# Patient Record
Sex: Male | Born: 1985
Health system: Southern US, Community
[De-identification: ages and names within clinical notes are randomized; demographics above are authoritative.]

## PROBLEM LIST (undated history)

## (undated) DIAGNOSIS — K219 Gastro-esophageal reflux disease without esophagitis: Secondary | ICD-10-CM

## (undated) HISTORY — PX: NO PAST SURGERIES: SHX2092

---
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
## 2011-01-31 ENCOUNTER — Ambulatory Visit
Admission: RE | Admit: 2011-01-31 | Discharge: 2011-01-31 | Disposition: A | Discharge status: Home / Self Care | Payer: Self-pay | Source: Ambulatory Visit | Attending: Occupational Medicine | Admitting: Occupational Medicine

## 2011-01-31 ENCOUNTER — Other Ambulatory Visit: Payer: Self-pay | Admitting: Occupational Medicine

## 2011-01-31 DIAGNOSIS — Z021 Encounter for pre-employment examination: Secondary | ICD-10-CM

## 2016-03-02 ENCOUNTER — Emergency Department (HOSPITAL_COMMUNITY)
Admission: EM | Admit: 2016-03-02 | Discharge: 2016-03-02 | Disposition: A | Discharge status: Home / Self Care | Payer: Worker's Compensation | Attending: Emergency Medicine | Admitting: Emergency Medicine

## 2016-03-02 ENCOUNTER — Encounter (HOSPITAL_COMMUNITY): Payer: Self-pay

## 2016-03-02 DIAGNOSIS — Z7721 Contact with and (suspected) exposure to potentially hazardous body fluids: Secondary | ICD-10-CM | POA: Diagnosis not present

## 2016-03-02 LAB — HIV ANTIBODY (ROUTINE TESTING W REFLEX): HIV SCREEN 4TH GENERATION: NONREACTIVE
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
## 2016-03-02 NOTE — Discharge Instructions (Signed)
Body Fluid Exposure Information  People may come into contact with blood and other body fluids under various circumstances. In some cases, body fluids may contain germs (bacteria or viruses) that cause infections. These germs can be spread when another person's body fluids come into contact with your skin, mouth, eyes, or genitals.   Exposure to body fluids that may contain infectious material is a common problem for people providing care for others who are ill. It can occur when a person is performing health care tasks in the workplace or when taking care of a family member at home. Other common methods of exposure include injection drug use, sharing needles, and sexual activity.  The risk of an infection spreading through body fluid exposure is small and depends on a variety of factors. This includes the type of body fluid, the nature of the exposure, and the health status of the person who was the source of the body fluids. Your health care provider can help you assess the risk.  WHAT TYPES OF BODY FLUID CAN SPREAD INFECTION?  The following types of body fluid have the potential to spread infections:   Blood.   Semen.   Vaginal secretions.   Urine.   Feces.   Saliva.   Nasal or eye discharge.   Breast milk.   Amniotic fluid and fluids surrounding body organs.  WHAT ARE SOME FIRST-AID MEASURES FOR BODY FLUID EXPOSURE?  The following steps should be taken as soon as possible after a person is exposed to body fluids:  Intact Skin   For contact with closed skin, wash the area with soap and water.  Broken Skin   For contact with broken skin (a wound), wash the area with soap and water. Let the area bleed a little. Then place a bandage or clean towel on the wound, applying gentle pressure to stop the bleeding. Do not squeeze or rub the area.   Use just water or hand sanitizer if a sink with soap is not available.   Do not use harsh chemicals such as bleach or iodine.  Eyes   Rinse the eyes with water or  saline for 30 seconds.   If the person is wearing contact lenses, leave the contact lenses in while rinsing the eyes. Once the rinsing is complete, remove the contact lenses.  Mouth   Spit out the fluids. Rinse and spit with water 4-5 times.  In addition, you should remove any clothing that comes into contact with body fluids. However, if body fluid exposure results from sexual assault, seek medical care immediately without changing clothes or bathing.  WHEN SHOULD YOU SEEK HELP?  After performing the proper first-aid steps, you should contact your health care provider or seek emergency care right away if blood or other body fluids made contact with areas of broken skin or openings such as the eyes or mouth. If the exposure to body fluid happened in the workplace, you should report it to your work supervisor immediately. Many workplaces have procedures in place for exposure situations.  WHAT WILL HAPPEN AFTER YOU REPORT THE EXPOSURE?  Your health care provider will ask you several questions. Information requested may include:   Your medical history, including vaccination records.   Date and time of the exposure.   Whether you saw body fluids during the exposure.   Type of body fluid you were exposed to.   Volume of body fluid you were exposed to.   How the exposure happened.   If any devices,   such as a needle, were being used.   Which area of your body made contact with the body fluid.   Description of any injury to the skin or other area.   How long contact was made with the body fluid.   Any information you have about the health status of the person whose body fluid you were exposed to.  The health care provider will assess your risk of infection. Often, no treatment is necessary. In some cases, the health care provider may recommend doing blood tests right away. Follow-up blood tests may also be done at certain intervals during the upcoming weeks and months to check for changes. You may be offered  treatment to prevent an infection from developing after exposure (post-exposure prophylaxis). This may include certain vaccinations or medicines and may be necessary when there is a risk of a serious infection, such as HIV or hepatitis B. Your health care provider should discuss appropriate treatment and vaccinations with you.  HOW CAN YOU PREVENT EXPOSURE AND INFECTION?  Always remember that prevention is the first line of defense against body fluid exposure. To help prevent exposure to body fluids:   Wash and disinfect countertops and other surfaces regularly.   Wear appropriate protective gear such as gloves, gowns, or eyewear when the possibility of exposure is present.   Wipe away spills of body fluid with disposable towels.   Properly dispose of blood products and other fluids. Use secured bags.   Properly dispose of needles and other instruments with sharp points or edges (sharps). Use closed, marked containers.   Avoid injection drug use.   Do not share needles.   Avoid recapping needles.   Use a condom during sexual intercourse.   Make sure you learn and follow any guidelines for preventing exposure (universal precautions) provided at your workplace.  To help reduce your chances of getting an infection:   Make sure your vaccinations are up-to-date, including those for tetanus and hepatitis.   Wash your hands frequently with soap and water. Use hand sanitizers.   Avoid having multiple sex partners.   Follow up with your health care provider as directed after being evaluated for an exposure to body fluids.  To avoid spreading infection to others:   Do not have sexual relations until you know you are free of infection.   Do not donate blood, plasma, breast milk, sperm, or other body fluids.   Do not share hygiene tools such as toothbrushes, razors, or dental floss.   Keep open wounds covered.   Dispose of any items with blood on them (razors, tampons, bandages) by putting them in the  trash.   Do not share drug supplies with others, such as needles, syringes, straws, or pipes.   Follow all of your health care provider's instructions for preventing the spread of infection.     This information is not intended to replace advice given to you by your health care provider. Make sure you discuss any questions you have with your health care provider.     Document Released: 08/17/2013 Document Revised: 12/20/2013 Document Reviewed: 08/17/2013  Elsevier Interactive Patient Education 2016 [employer]

## 2016-03-02 NOTE — ED Notes (Signed)
Went over discharge instructions and follow-up information with patient, who verbalized understanding and denies any further needs or questions at this time. VS stable. Patient ambulatory with steady gait. 
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
## 2016-03-03 LAB — HEPATITIS PANEL, ACUTE
HEP B S AG: NEGATIVE
Hep A IgM: NEGATIVE
Hep B C IgM: NEGATIVE

## 2016-10-04 ENCOUNTER — Ambulatory Visit (INDEPENDENT_AMBULATORY_CARE_PROVIDER_SITE_OTHER): Payer: Worker's Compensation

## 2016-10-04 ENCOUNTER — Encounter (HOSPITAL_COMMUNITY): Payer: Self-pay | Admitting: Emergency Medicine

## 2016-10-04 ENCOUNTER — Ambulatory Visit (HOSPITAL_COMMUNITY)
Admission: EM | Admit: 2016-10-04 | Discharge: 2016-10-04 | Disposition: A | Discharge status: Home / Self Care | Payer: Worker's Compensation | Attending: Internal Medicine | Admitting: Internal Medicine

## 2016-10-04 DIAGNOSIS — T148XXA Other injury of unspecified body region, initial encounter: Secondary | ICD-10-CM

## 2016-10-04 MED ORDER — CYCLOBENZAPRINE HCL 10 MG PO TABS: 10.0000 mg | ORAL_TABLET | Freq: Three times a day (TID) | ORAL | 0 refills | Status: AC

## 2016-10-04 MED ORDER — TRAMADOL HCL 50 MG PO TABS: 50.0000 mg | ORAL_TABLET | Freq: Four times a day (QID) | ORAL | 0 refills | Status: DC | PRN
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
## 2017-04-09 ENCOUNTER — Encounter (INDEPENDENT_AMBULATORY_CARE_PROVIDER_SITE_OTHER): Payer: Self-pay

## 2017-04-09 ENCOUNTER — Encounter: Payer: Self-pay | Admitting: Gastroenterology

## 2017-04-09 ENCOUNTER — Other Ambulatory Visit: Payer: Self-pay

## 2017-04-09 ENCOUNTER — Ambulatory Visit (INDEPENDENT_AMBULATORY_CARE_PROVIDER_SITE_OTHER): Payer: 59 | Admitting: Gastroenterology

## 2017-04-09 VITALS — BP 147/91 | HR 96 | Temp 98.2°F | Ht 71.0 in | Wt 222.8 lb

## 2017-04-09 DIAGNOSIS — K2100 Gastro-esophageal reflux disease with esophagitis, without bleeding: Secondary | ICD-10-CM

## 2017-04-09 DIAGNOSIS — K219 Gastro-esophageal reflux disease without esophagitis: Secondary | ICD-10-CM | POA: Diagnosis not present

## 2017-04-09 NOTE — Progress Notes (Signed)
Gastroenterology Consultation  Referring Provider:     No ref. provider found Primary Care Physician:  No PCP Per Patient Primary Gastroenterologist:  Dr. Wyline Mood  Reason for Consultation:     Reflux         HPI:   Benjamin Montgomery is a 31 y.o. y/o male referred for consultation & management  by Dr. Bonnetta Barry PCP Per Patient.    He has been referred for reflux and hoarsness . He underwent laryngoscopy a month by ENT and was told her had an "ulcer", he had lost his voice after the flu and was evaluated by ENT. He was commenced on omeprazole after. This was a month back .   Reflux:  Onset : Began last year Symptoms: change of voice/hoarseness, hard to speak , itching sensation in his throat  Recent weight gain: no  Medications: flonase Narcotics or anticholinergics use : no  PPI /H2 blockers or Antacid  use and timing :omeprazole 40 mg , taken as needed  Dinner time : 6-7 pm , bedtime at 11.30-12 midnight , sits upright but may have a a snack before bedtime  Prior EGD: never  Family history of esophageal cancer:no   Colon cancer in his father in his 80's .   Presently he is off PPI and has absolutely no symptoms.   No past medical history on file.  No past surgical history on file.  Prior to Admission medications   Medication Sig Start Date End Date Taking? Authorizing Provider  amphetamine-dextroamphetamine (ADDERALL XR) 25 MG 24 hr capsule Take 25 mg by mouth every morning.    Historical Provider, MD  HYDROMET 5-1.5 MG/5ML syrup  03/11/17   Historical Provider, MD  ibuprofen (ADVIL,MOTRIN) 200 MG tablet Take 200 mg by mouth every 6 (six) hours as needed.    Historical Provider, MD  ibuprofen (ADVIL,MOTRIN) 800 MG tablet  01/21/17   Historical Provider, MD  levofloxacin (LEVAQUIN) 500 MG tablet  03/11/17   Historical Provider, MD  oseltamivir (TAMIFLU) 75 MG capsule  01/21/17   Historical Provider, MD  promethazine-dextromethorphan (PROMETHAZINE-DM) 6.25-15 MG/5ML syrup  01/21/17    Historical Provider, MD  traMADol (ULTRAM) 50 MG tablet Take 1 tablet (50 mg total) by mouth every 6 (six) hours as needed. 10/04/16   Lucia Estelle, NP    Family History  Problem Relation Age of Onset  . Hyperlipidemia Mother   . Hypertension Mother   . Cancer Father      Social History  Substance Use Topics  . Smoking status: Never Smoker  . Smokeless tobacco: Never Used  . Alcohol use Yes     Comment: 3 times a week    Allergies as of 04/09/2017  . (No Known Allergies)    Review of Systems:    All systems reviewed and negative except where noted in HPI.   Physical Exam:  There were no vitals taken for this visit. No LMP for male patient. Psych:  Alert and cooperative. Normal mood and affect. General:   Alert,  Well-developed, well-nourished, pleasant and cooperative in NAD Head:  Normocephalic and atraumatic. Eyes:  Sclera clear, no icterus.   Conjunctiva pink. Ears:  Normal auditory acuity. Nose:  No deformity, discharge, or lesions. Mouth:  No deformity or lesions,oropharynx pink & moist. Neck:  Supple; no masses or thyromegaly. Lungs:  Respirations even and unlabored.  Clear throughout to auscultation.   No wheezes, crackles, or rhonchi. No acute distress. Heart:  Regular rate and rhythm; no murmurs, clicks, rubs,  or gallops. Abdomen:  Normal bowel sounds.  No bruits.  Soft, non-tender and non-distended without masses, hepatosplenomegaly or hernias noted.  No guarding or rebound tenderness.    Msk:  Symmetrical without gross deformities. Good, equal movement & strength bilaterally. Pulses:  Normal pulses noted.e. Lymph Nodes:  No significant cervical adenopathy. Psych:  Alert and cooperative. Normal mood and affect.  Imaging Studies: No results found.  Assessment and Plan:   Benjamin Montgomery is a 31 y.o. y/o male has been referred for reflux.Based on his history it appears he had a bad bout of reflux during an episode of flu when he had a lot of coughing which may have  increased his intrabdominal pressure during every spell of coughing and caused reflux and subsequent laryngeal inflammation. Presently he has no symptoms. Suggested life style changes. He has had a foreign body sensation in his throat and will proceed with an endoscopy, if it shows esophagitis off PPi then would be an indication for long term acid supression.   1. GERD : Counseled on life style changes, suggest to use PPI first thing in the morning on empty stomach and eat 30 minutes after. Advised on the use of a wedge pillow at night , avoid meals for 2 hours prior to bed time.   Discussed the risks and benefits of long term PPI use including but not limited to bone loss, chronic kidney disease, infections , low magnesium . Aim to use at the lowest dose for the shortest period of time if we did in the future  2. I will try and obtain the ENT report where he apparently underwent a laryngoscopy .   3. EGD  I have discussed alternative options, risks & benefits,  which include, but are not limited to, bleeding, infection, perforation,respiratory complication & drug reaction.  The patient agrees with this plan & written consent will be obtained.    Follow up PRN  Dr Wyline Mood MD

## 2017-04-17 ENCOUNTER — Telehealth: Payer: Self-pay | Admitting: Gastroenterology

## 2017-04-17 NOTE — Telephone Encounter (Signed)
04/17/17 Spoke with Vonna Kotyk at [facility] and Berkley Harvey is required for EGD 43235 / K21.9 Auth # [id number]

## 2017-05-11 ENCOUNTER — Telehealth: Payer: Self-pay | Admitting: Gastroenterology

## 2017-05-11 NOTE — Telephone Encounter (Signed)
Patient called to cancel his procedure. He said he was doing better and financially he can't afford it at this time. I called [facility] to cancel

## 2017-05-12 ENCOUNTER — Encounter: Admission: RE | Payer: Self-pay | Source: Ambulatory Visit

## 2017-05-12 ENCOUNTER — Ambulatory Visit: Admission: RE | Admit: 2017-05-12 | Payer: 59 | Source: Ambulatory Visit | Admitting: Gastroenterology

## 2017-05-12 SURGERY — ESOPHAGOGASTRODUODENOSCOPY (EGD) WITH PROPOFOL
Anesthesia: General

## 2017-06-18 ENCOUNTER — Ambulatory Visit (INDEPENDENT_AMBULATORY_CARE_PROVIDER_SITE_OTHER): Payer: 59

## 2017-06-18 ENCOUNTER — Encounter: Payer: Self-pay | Admitting: Sports Medicine

## 2017-06-18 ENCOUNTER — Ambulatory Visit (INDEPENDENT_AMBULATORY_CARE_PROVIDER_SITE_OTHER): Payer: 59 | Admitting: Sports Medicine

## 2017-06-18 VITALS — BP 170/92 | HR 97 | Ht 71.0 in | Wt 223.4 lb

## 2017-06-18 DIAGNOSIS — M19071 Primary osteoarthritis, right ankle and foot: Secondary | ICD-10-CM | POA: Insufficient documentation

## 2017-06-18 DIAGNOSIS — M25571 Pain in right ankle and joints of right foot: Secondary | ICD-10-CM

## 2017-06-18 DIAGNOSIS — S93324S Dislocation of tarsometatarsal joint of right foot, sequela: Secondary | ICD-10-CM | POA: Diagnosis not present

## 2017-06-18 NOTE — Progress Notes (Signed)
OFFICE VISIT NOTE Benjamin Montgomery. [name] at [facility] [phone]  [name] - 31 y.o. male MRN [id number]  Date of birth: [birth year]  Visit Date: 06/18/2017  PCP: Patient, No Pcp Per   Referred by: No ref. provider found  [name], CMA acting as scribe for Dr. Berline Chough.  SUBJECTIVE:   Chief Complaint  Patient presents with  . right ankle injury   HPI: As below and per problem based documentation when appropriate.  Pt presents today with complaint of pain in the right ankle. Pain is worse in the morning and at night but constant throughout the day. Pain is located on the lateral aspect of the ankle and the dorsal aspect of the mid foot. He did notice some swelling in the ankle and mild skin discoloration yesterday.  Pt started feeling pain in the right ankle 2 days ago and it has progressively gotten worse.  The pain started after running a long distance. It didn't hurt initially but as time went by it started and got worse.  He does report a prior significant foot/ankle injury in high school but had no sequela until this time.  The pain is described as sharp and is rated as 6-10/10 in the morning and 5/10 throughout the day.  Worsened with heel strike and supination of the ankle.  Improves with icing and compression Therapies tried include : 800 mg Ibuprofen with some relief.   Other associated symptoms include: Pt denies pain in the foot, leg, knee.   Pt denies fever, chills, night sweats.     Review of Systems  Constitutional: Negative for chills and fever.  Respiratory: Negative for shortness of breath and wheezing.   Cardiovascular: Positive for leg swelling. Negative for chest pain and palpitations.  Musculoskeletal: Negative for falls.  Neurological: Negative for dizziness, tingling and headaches.  Endo/Heme/Allergies: Does not bruise/bleed easily.    Otherwise per HPI.  HISTORY & PERTINENT PRIOR DATA:  No  specialty comments available. He reports that he has never smoked. He has never used smokeless tobacco. No results for input(s): HGBA1C, LABURIC in the last 8760 hours. Medications & Allergies reviewed per EMR Patient Active Problem List   Diagnosis Date Noted  . Arthritis of foot, right 06/18/2017  . Lisfranc dislocation, right, sequela 06/18/2017   No past medical history on file. Family History  Problem Relation Age of Onset  . Hyperlipidemia Mother   . Hypertension Mother   . Cancer Father    No past surgical history on file. Social History   Occupational History  . Not on file.   Social History Main Topics  . Smoking status: Never Smoker  . Smokeless tobacco: Never Used  . Alcohol use Yes     Comment: 3 times a week  . Drug use: No  . Sexual activity: Not on file    OBJECTIVE:  VS:  HT:5\' 11"  (180.3 cm)   WT:223 lb 6.4 oz (101.3 kg)  BMI:31.2    BP:(!) 170/92  HR:97bpm  TEMP: ( )  RESP:98 % EXAM: Findings:  WDWN, NAD, Non-toxic appearing Alert & appropriately interactive Not depressed or anxious appearing No increased work of breathing. Pupils are equal. EOM intact without nystagmus No clubbing or cyanosis of the extremities appreciated No significant rashes/lesions/ulcerations overlying the examined area. DP & PT pulses 2+/4.  No significant pretibial edema. Sensation intact to light touch in lower extremities.  Bilateral feet: Markedly flat feet with significant widening of the  right greater than left forefoot.  He has early bunion formation as well as leg to her second toes right greater than left.  He has bossing across the dorsum of the right midfoot and a moderate amount of pain with forefoot abduction.  X-ray is independently reviewed by myself and do show significant widening between the first and second metatarsals on the right foot, normal on the left.  There are degenerative changes.     Dg Foot 2 Views Left  Result Date: 06/18/2017 CLINICAL  DATA:  Left foot pain EXAM: LEFT FOOT - 2 VIEW COMPARISON:  None. FINDINGS: There is no evidence of fracture or dislocation. There is no evidence of arthropathy or other focal bone abnormality. Soft tissues are unremarkable. IMPRESSION: 1. No acute osseous injury of the left foot. If there is persisting clinical concern regarding Lisfranc injury, recommend evaluation with CT of the left foot. Electronically Signed   By: Elige KoHetal  Patel   On: 06/18/2017 11:57   Dg Foot Complete Right  Result Date: 06/18/2017 CLINICAL DATA:  Right ankle pain after running long distance EXAM: RIGHT FOOT COMPLETE - 3+ VIEW COMPARISON:  None. FINDINGS: No acute bony abnormality. Specifically, no fracture, subluxation, or dislocation. Soft tissues are intact. Spurring noted along the dorsum of the midfoot in the tarsal bones. Joint spaces are maintained. IMPRESSION: No acute bony abnormality. Electronically Signed   By: Charlett NoseKevin  Dover M.D.   On: 06/18/2017 09:39   ASSESSMENT & PLAN:   Problem List Items Addressed This Visit    Arthritis of foot, right    Age advanced arthritis.  >50% of this 30 minute visit spent in direct patient counseling and/or coordination of care.  Discussion was focused on education regarding the in discussing the pathoetiology and anticipated clinical course of the above condition.  Does seem to be most consistent with prior Lisfranc injury.  Activity modifications discussed in detail today.      Relevant Medications   ibuprofen (ADVIL,MOTRIN) 800 MG tablet   Lisfranc dislocation, right, sequela    Correlated Lisfranc injury in high school that has resulted in breakdown right midfoot.  He has H advanced arthritis which is likely due to hypermobility in this region.  He is not interested and fusion at this time like to try conservative measures including custom cushion orthotics.  He will follow-up for this.  Discussed activity modifications including minimizing significant pounding and weightbearing  activities.  No work restriction at this time.       Other Visit Diagnoses    Right ankle pain, unspecified chronicity    -  Primary   Relevant Orders   DG Foot Complete Right (Completed)   DG Foot 2 Views Left (Completed)      Follow-up: Return for orthotics.   CMA/ATC served as Neurosurgeonscribe during this visit. History, Physical, and Plan performed by medical provider. Documentation and orders reviewed and attested to.      Gaspar BiddingMichael Aquanetta Schwarz, DO    [facility]

## 2017-06-18 NOTE — Patient Instructions (Signed)
I recommend you obtained a compression sleeve to help with your joint problems. There are many options on the market however I recommend obtaining a full ankle Body Helix compression sleeve.  You can find information (including how to appropriate measure yourself for sizing) can be found at www.Body Helix.com.  Many of these products are health savings account (HSA) eligible.   You can use the compression sleeve at any time throughout the day but is most important to use while being active as well as for 2 hours post-activity.   It is appropriate to ice following activity with the compression sleeve in place.   

## 2017-06-29 ENCOUNTER — Ambulatory Visit (INDEPENDENT_AMBULATORY_CARE_PROVIDER_SITE_OTHER): Payer: 59 | Admitting: Sports Medicine

## 2017-06-29 ENCOUNTER — Encounter: Payer: Self-pay | Admitting: Sports Medicine

## 2017-06-29 VITALS — BP 140/92 | HR 76 | Ht 71.0 in | Wt 219.0 lb

## 2017-06-29 DIAGNOSIS — S93324S Dislocation of tarsometatarsal joint of right foot, sequela: Secondary | ICD-10-CM

## 2017-06-29 NOTE — Progress Notes (Signed)
OFFICE VISIT NOTE Benjamin FellsMichael D. Montgomery Shinerigby, DO  [name] [phone]  Benjamin LoganJohn Montgomery - 31 y.o. male MRN [id number]  Date of birth: 1986-12-04  Visit Date: 06/29/2017  PCP: Patient, No Pcp Per   Referred by: No ref. provider found  Benjamin DakinBrandy Montgomery, CMA acting as scribe for Dr. Berline Montgomery.  SUBJECTIVE:   Chief Complaint  Patient presents with  . Follow-up    ankle pain, Lisfranc dislocation, right ankle   HPI: As below and per problem based documentation when appropriate.  Pt presents today to discuss orthotics. He was seen 06/18/17 with complaint of ankle pain. Pain started around 06/16/17 after running for long distances. He had no pain while running, just after completing his run. Pain was occurring mostly during heel strike. He had significant ankle injury in high school but no reoccurrence of pain since then until recently. He had been trying Ibuprofen 800 mg, icing, and elevating. He was advised to minimize significant pounding and weight bearing exercises after his last visit. As of his last visit he was not interested in a fusion but wanted to try more conservative measures including custom cushion orthotics.  He had xray of left and right foot 06/18/17. Xray of right foot was normal, xray of left foot showed the following:  IMPRESSION: 1. No acute osseous injury of the left foot. If there is persisting clinical concern regarding Lisfranc injury, recommend evaluation with CT of the left foot.  Pt reports improvement with ankle pain while wearing his Body Helix. He has minimized running but admits that other than that he has not been minimizing pounding or weight bearing exercises. He has not taken the Ibuprofen since last week. He no longer needs to ice or elevate the ankle.   Pt denies fever, chills, night sweats    Review of Systems  Constitutional: Negative for chills and fever.  Respiratory: Negative for shortness of breath and wheezing.    Cardiovascular: Negative for chest pain and palpitations.  Musculoskeletal: Negative for falls.  Neurological: Negative for dizziness, tingling and headaches.  Endo/Heme/Allergies: Does not bruise/bleed easily.    Otherwise per HPI.  HISTORY & PERTINENT PRIOR DATA:  No specialty comments available. He reports that he has never smoked. He has never used smokeless tobacco. No results for input(s): HGBA1C, LABURIC in the last 8760 hours. Medications & Allergies reviewed per EMR Patient Active Problem List   Diagnosis Date Noted  . Arthritis of foot, right 06/18/2017  . Lisfranc dislocation, right, sequela 06/18/2017   No past medical history on file. Family History  Problem Relation Age of Onset  . Hyperlipidemia Mother   . Hypertension Mother   . Cancer Father    No past surgical history on file. Social History   Occupational History  . Not on file.   Social History Main Topics  . Smoking status: Never Smoker  . Smokeless tobacco: Never Used  . Alcohol use Yes     Comment: 3 times a week  . Drug use: No  . Sexual activity: Not on file    OBJECTIVE:  VS:  HT:5\' 11"  (180.3 cm)   WT:219 lb (99.3 kg)  BMI:30.6    BP:(!) 140/92  HR:76bpm  TEMP: ( )  RESP:98 % EXAM: Findings:  WDWN, NAD, Non-toxic appearing Alert & appropriately interactive Not depressed or anxious appearing No increased work of breathing. Pupils are equal. EOM intact without nystagmus No clubbing or cyanosis of the extremities appreciated No significant rashes/lesions/ulcerations  overlying the examined area. DP & PT pulses 2+/4.  No significant pretibial edema. Sensation intact to light touch in lower extremities.  Bilateral feet: Markedly flat feet with significant widening of the right greater than left forefoot.  He has early bunion formation as well splay toe of second toes right greater than left.  He has bossing across the dorsum of the right midfoot and a mild amount of pain with forefoot  abduction.     Dg Foot 2 Views Left  Result Date: 06/18/2017 CLINICAL DATA:  Left foot pain EXAM: LEFT FOOT - 2 VIEW COMPARISON:  None. FINDINGS: There is no evidence of fracture or dislocation. There is no evidence of arthropathy or other focal bone abnormality. Soft tissues are unremarkable. IMPRESSION: 1. No acute osseous injury of the left foot. If there is persisting clinical concern regarding Lisfranc injury, recommend evaluation with CT of the left foot. Electronically Signed   By: Benjamin Montgomery   On: 06/18/2017 11:57   Dg Foot Complete Right  Result Date: 06/18/2017 CLINICAL DATA:  Right ankle pain after running long distance EXAM: RIGHT FOOT COMPLETE - 3+ VIEW COMPARISON:  None. FINDINGS: No acute bony abnormality. Specifically, no fracture, subluxation, or dislocation. Soft tissues are intact. Spurring noted along the dorsum of the midfoot in the tarsal bones. Joint spaces are maintained. IMPRESSION: No acute bony abnormality. Electronically Signed   By: Benjamin Montgomery M.D.   On: 06/18/2017 09:39   ASSESSMENT & PLAN:   Problem List Items Addressed This Visit    Lisfranc dislocation, right, sequela - Primary    PROCEDURE: CUSTOM ORTHOTIC FABRICATION Patient's underlying musculoskeletal conditions are directly related to poor biomechanics and will benefit from a functional custom orthotic.  There are no significant foot deformities that complicate the use of a custom orthotic.  The patient was fitted for a standard, cushioned, semi-rigid orthotic. The orthotic was heated & placed on the orthotic stand. The patient was positioned in subtalar neutral position and 10 of ankle dorsiflexion and weight bearing stance some heated orthotic blank. After completion of the molding a stable paste was applied to the orthotic blank. The orthotic was ground to a stable position for weightbearing. The patient ambulated in these and reported they were comfortable without pressure spots.              BLANK:    Size 10 - Standard Cushioned                 BASE:  Blue EVA      POSTINGS:  N/a          Follow-up: Return if symptoms worsen or fail to improve.   CMA/ATC served as Neurosurgeon during this visit. History, Physical, and Plan performed by medical provider. Documentation and orders reviewed and attested to.      Gaspar Bidding, DO    [facility]
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
## 2017-06-29 NOTE — Assessment & Plan Note (Signed)
Age advanced arthritis.  >50% of this 30 minute visit spent in direct patient counseling and/or coordination of care.  Discussion was focused on education regarding the in discussing the pathoetiology and anticipated clinical course of the above condition.  Does seem to be most consistent with prior Lisfranc injury.  Activity modifications discussed in detail today.

## 2017-06-30 NOTE — Assessment & Plan Note (Signed)
PROCEDURE: CUSTOM ORTHOTIC FABRICATION Patient's underlying musculoskeletal conditions are directly related to poor biomechanics and will benefit from a functional custom orthotic.  There are no significant foot deformities that complicate the use of a custom orthotic.  The patient was fitted for a standard, cushioned, semi-rigid orthotic. The orthotic was heated & placed on the orthotic stand. The patient was positioned in subtalar neutral position and 10 of ankle dorsiflexion and weight bearing stance some heated orthotic blank. After completion of the molding a stable paste was applied to the orthotic blank. The orthotic was ground to a stable position for weightbearing. The patient ambulated in these and reported they were comfortable without pressure spots.              BLANK:  Size 10 - Standard Cushioned                 BASE:  Blue EVA      POSTINGS:  N/a

## 2017-09-20 ENCOUNTER — Emergency Department (HOSPITAL_COMMUNITY): Payer: Worker's Compensation

## 2017-09-20 ENCOUNTER — Emergency Department (HOSPITAL_COMMUNITY)
Admission: EM | Admit: 2017-09-20 | Discharge: 2017-09-20 | Disposition: A | Discharge status: Home / Self Care | Payer: Worker's Compensation | Attending: Emergency Medicine | Admitting: Emergency Medicine

## 2017-09-20 ENCOUNTER — Encounter (HOSPITAL_COMMUNITY): Payer: Self-pay | Admitting: *Deleted

## 2017-09-20 DIAGNOSIS — Y9389 Activity, other specified: Secondary | ICD-10-CM | POA: Diagnosis not present

## 2017-09-20 DIAGNOSIS — S39012A Strain of muscle, fascia and tendon of lower back, initial encounter: Secondary | ICD-10-CM | POA: Insufficient documentation

## 2017-09-20 DIAGNOSIS — M62830 Muscle spasm of back: Secondary | ICD-10-CM | POA: Diagnosis not present

## 2017-09-20 DIAGNOSIS — Y990 Civilian activity done for income or pay: Secondary | ICD-10-CM | POA: Diagnosis not present

## 2017-09-20 DIAGNOSIS — Y929 Unspecified place or not applicable: Secondary | ICD-10-CM | POA: Insufficient documentation

## 2017-09-20 DIAGNOSIS — M545 Low back pain: Secondary | ICD-10-CM | POA: Diagnosis present

## 2017-09-20 DIAGNOSIS — Y33XXXA Other specified events, undetermined intent, initial encounter: Secondary | ICD-10-CM | POA: Diagnosis not present

## 2017-09-20 MED ORDER — HYDROCODONE-ACETAMINOPHEN 5-325 MG PO TABS: 1.0000 | ORAL_TABLET | Freq: Four times a day (QID) | ORAL | 0 refills | Status: DC | PRN

## 2017-09-20 MED ORDER — BACLOFEN 10 MG PO TABS: 10.0000 mg | ORAL_TABLET | Freq: Three times a day (TID) | ORAL | 0 refills | Status: DC

## 2017-09-20 MED ORDER — DEXAMETHASONE SODIUM PHOSPHATE 10 MG/ML IJ SOLN
10.0000 mg | Freq: Once | INTRAMUSCULAR | Status: AC
  Administered 2017-09-20: 10 mg via INTRAMUSCULAR
  Filled 2017-09-20: qty 1

## 2017-09-20 MED ORDER — KETOROLAC TROMETHAMINE 60 MG/2ML IM SOLN
60.0000 mg | Freq: Once | INTRAMUSCULAR | Status: AC
  Administered 2017-09-20: 60 mg via INTRAMUSCULAR
  Filled 2017-09-20: qty 2

## 2017-09-20 MED ORDER — MELOXICAM 15 MG PO TABS: 15.0000 mg | ORAL_TABLET | Freq: Every day | ORAL | 0 refills | Status: DC
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
## 2017-09-20 NOTE — Discharge Instructions (Signed)
Your imaging was negative.  SEEK IMMEDIATE MEDICAL ATTENTION IF: New numbness, tingling, weakness, or problem with the use of your arms or legs.  Severe back pain not relieved with medications.  Change in bowel or bladder control.  Increasing pain in any areas of the body (such as chest or abdominal pain).  Shortness of breath, dizziness or fainting.  Nausea (feeling sick to your stomach), vomiting, fever, or sweats.

## 2017-09-20 NOTE — ED Notes (Signed)
Patient transported to X-ray 

## 2017-09-20 NOTE — ED Triage Notes (Signed)
PT was getting stuff off truck, step down and took 3 steps and felt a sharp pain in right lower back and not improving.  Happened this am

## 2017-09-20 NOTE — ED Notes (Signed)
ED Provider at bedside. 

## 2017-11-03 ENCOUNTER — Telehealth: Payer: Self-pay | Admitting: General Practice

## 2017-11-03 NOTE — Telephone Encounter (Signed)
Patient calling to state he moved and just received a bill with an outstanding balance. Patient wants to know if we have the ability to pull up HSA information as they have lost their card, but want to pay the balance. I informed patient he may need to contact his insurance, but I was ultimately unsure. Asked for return call when convenient.  Please advise.

## 2017-11-05 NOTE — Telephone Encounter (Signed)
After reading the message below, this is something billing would be able to help with. Please call patient and provide billing number for him to contact.

## 2017-11-09 NOTE — Telephone Encounter (Signed)
Explained to pt he would need to contact billing to try and resolve issue per [place]. Pt acknowledged understanding and stated he would call back later to get billing's number. No further action needed.

## 2018-03-01 DIAGNOSIS — R6882 Decreased libido: Secondary | ICD-10-CM | POA: Diagnosis not present

## 2018-09-03 ENCOUNTER — Other Ambulatory Visit: Payer: Self-pay | Admitting: Internal Medicine

## 2018-09-03 ENCOUNTER — Ambulatory Visit
Admission: RE | Admit: 2018-09-03 | Discharge: 2018-09-03 | Disposition: A | Discharge status: Home / Self Care | Payer: 59 | Source: Ambulatory Visit | Attending: Internal Medicine | Admitting: Internal Medicine

## 2018-09-03 DIAGNOSIS — M79671 Pain in right foot: Secondary | ICD-10-CM

## 2018-11-23 DIAGNOSIS — Z23 Encounter for immunization: Secondary | ICD-10-CM | POA: Diagnosis not present

## 2019-01-30 DIAGNOSIS — M109 Gout, unspecified: Secondary | ICD-10-CM | POA: Diagnosis not present

## 2019-01-31 ENCOUNTER — Ambulatory Visit: Payer: 59 | Admitting: Sports Medicine

## 2019-01-31 ENCOUNTER — Ambulatory Visit: Payer: Self-pay

## 2019-01-31 ENCOUNTER — Encounter: Payer: Self-pay | Admitting: Sports Medicine

## 2019-01-31 VITALS — BP 130/86 | HR 72 | Ht 71.0 in | Wt 239.4 lb

## 2019-01-31 DIAGNOSIS — M25475 Effusion, left foot: Secondary | ICD-10-CM

## 2019-01-31 DIAGNOSIS — M79675 Pain in left toe(s): Secondary | ICD-10-CM | POA: Diagnosis not present

## 2019-01-31 NOTE — Patient Instructions (Signed)

## 2019-01-31 NOTE — Procedures (Signed)
PROCEDURE NOTE:  Ultrasound Guided: Injection: Left Great toe, MTP, Intra-articular Images were obtained and interpreted by myself, Gaspar Bidding, DO  Images have been saved and stored to PACS system. Images obtained on: GE S7 Ultrasound machine    ULTRASOUND FINDINGS:  The first MTP has a moderate effusion with intracellular debris/starry night appearance consistent with likely gouty arthropathy.  DESCRIPTION OF PROCEDURE:  The patient's clinical condition is marked by substantial pain and/or significant functional disability. Other conservative therapy has not provided relief, is contraindicated, or not appropriate. There is a reasonable likelihood that injection will significantly improve the patient's pain and/or functional impairment.   After discussing the risks, benefits and expected outcomes of the injection and all questions were reviewed and answered, the patient wished to undergo the above named procedure.  Verbal consent was obtained.  The ultrasound was used to identify the target structure and adjacent neurovascular structures. The skin was then prepped in sterile fashion and the target structure was injected under direct visualization using sterile technique as below:  Single injection performed as below: PREP: Alcohol and Ethel Chloride APPROACH:direct, single injection, 25g 1.5 in. INJECTATE: 0.5 cc 0.5% Marcaine and 0.5 cc 80mg /mL DepoMedrol ASPIRATE: None DRESSING: Band-Aid  Post procedural instructions including recommending icing and warning signs for infection were reviewed.    This procedure was well tolerated and there were no complications.   IMPRESSION: Succesful Ultrasound Guided: Injection

## 2019-01-31 NOTE — Progress Notes (Signed)
Benjamin Montgomery. Benjamin Montgomery Sports Medicine [facility] at [facility] [phone]  Benjamin Montgomery - 33 y.o. male MRN [id number]  Date of birth: [birth year]  Visit Date: January 31, 2019  PCP: Benjamin Noble, MD   Referred by: Benjamin Noble, MD  SUBJECTIVE:  Chief Complaint  Patient presents with  . Left Foot - Initial Assessment    Sx x 3 days. Worse w/ wt-bearing. Some swelling around big toe. XR and Toradol inj at [facility] 01/30/19.   . Initial Assessment    L foot (great toe) pain    HPI: Patient presents with 3 days of worsening left great toe pain.  This is been present since Saturday afternoon when he was jumping at a trampoline park.  Following this he had slow but significant worsening of pain that localized to the left great toe.  He denies any significant known trauma.  He has been training for half marathon.  He does have a history of Lisfranc and is however has been well from this regard.  He denies any family history of gout.  Denies any changes in diet.  No prior episodes that are similar to this.  [facility] where [facility] were obtained that were normal and prescription for anti-inflammatory was provided that he has not picked up.  He did get a shot of Toradol with only minimal improvement.  REVIEW OF SYSTEMS: He is having some pain that keeps him awake at night due to this.  Otherwise his health is good  HISTORY:  Prior history reviewed and updated per electronic medical record.  Patient Active Problem List   Diagnosis Date Noted  . Arthritis of foot, right 06/18/2017  . Lisfranc dislocation, right, sequela 06/18/2017   Social History   Occupational History  . Not on file  Tobacco Use  . Smoking status: Never Smoker  . Smokeless tobacco: Current User    Types: Chew  Substance and Sexual Activity  . Alcohol use: Yes    Comment: 3 times a week  . Drug use: No  . Sexual activity: Not on file   Social History   Social History Narrative   . Not on file    OBJECTIVE:  VS:  HT:5\' 11"  (180.3 cm)   WT:239 lb 6.4 oz (108.6 kg)  BMI:33.4    BP:130/86  HR:72bpm  TEMP: ( )  RESP:97 %   PHYSICAL EXAM: Adult male.  In no acute distress.  Alert and appropriate.  His left great toe has a moderate amount of swelling and generalized erythema surrounding it there is good capillary refill.  DP and PT pulses are 2+/4.  No significant pretibial edema.  He has a markedly high cavus foot.  He has pain with any type of active or passive range of motion of his MTP on the left foot.  Normal on the right.  He has marked pain with direct palpation over the MTP.  Mild pain with metatarsal squeeze as long as avoid the MTP.  He has no pain with IP range of motion.   ASSESSMENT:   1. Effusion of joint of toe of left foot   2. Pain of toe of left foot     PROCEDURES:  US Guided Injection per procedure note       PLAN:  Pertinent additional documentation may be included in corresponding procedure notes, imaging studies, problem based documentation and patient instructions.  No problem-specific Assessment & Plan notes found for this encounter.  Ultimately symptoms are consistent with a great toe effusion likely secondary to gout.  We will go ahead and perform an this today and plan to have him follow-up in 6 weeks for lab work.  Activity modifications and the importance of avoiding exacerbating activities (limiting pain to no more than a 4 / 10 during or following activity) recommended and discussed.   Discussed red flag symptoms that warrant earlier emergent evaluation and patient voices understanding.    No orders of the defined types were placed in this encounter.  Lab Orders  No laboratory test(s) ordered today    Imaging Orders     [place] [facility] POCT ULTRASOUND Referral Orders  No referral(s) requested today    Return in about 6 weeks (around 03/14/2019) for lab check.          Benjamin Mews, DO     Sports Medicine  Physician

## 2019-03-14 ENCOUNTER — Other Ambulatory Visit: Payer: Self-pay

## 2019-03-14 ENCOUNTER — Ambulatory Visit (INDEPENDENT_AMBULATORY_CARE_PROVIDER_SITE_OTHER): Payer: 59 | Admitting: Sports Medicine

## 2019-03-14 ENCOUNTER — Encounter: Payer: Self-pay | Admitting: Sports Medicine

## 2019-03-14 VITALS — BP 130/84 | HR 74 | Ht 71.0 in | Wt 231.6 lb

## 2019-03-14 DIAGNOSIS — M25475 Effusion, left foot: Secondary | ICD-10-CM

## 2019-03-14 DIAGNOSIS — M79675 Pain in left toe(s): Secondary | ICD-10-CM

## 2019-03-14 LAB — COMPREHENSIVE METABOLIC PANEL
ALK PHOS: 49 U/L (ref 39–117)
ALT: 23 U/L (ref 0–53)
AST: 18 U/L (ref 0–37)
Albumin: 4.5 g/dL (ref 3.5–5.2)
BILIRUBIN TOTAL: 0.6 mg/dL (ref 0.2–1.2)
BUN: 14 mg/dL (ref 6–23)
CO2: 29 meq/L (ref 19–32)
CREATININE: 1.18 mg/dL (ref 0.40–1.50)
Calcium: 9.5 mg/dL (ref 8.4–10.5)
Chloride: 104 mEq/L (ref 96–112)
GFR: 71.94 mL/min (ref >60.00)
Glucose, Bld: 100 mg/dL — ABNORMAL HIGH (ref 70–99)
Potassium: 4.5 mEq/L (ref 3.5–5.1)
Sodium: 139 mEq/L (ref 135–145)
Total Protein: 6.8 g/dL (ref 6.0–8.3)

## 2019-03-14 LAB — CBC
HCT: 44.3 % (ref 39.0–52.0)
Hemoglobin: 15.1 g/dL (ref 13.0–17.0)
MCHC: 34 g/dL (ref 30.0–36.0)
MCV: 89 fl (ref 78.0–100.0)
Platelets: 223 10*3/uL (ref 150.0–400.0)
RBC: 4.98 Mil/uL (ref 4.22–5.81)
RDW: 13.7 % (ref 11.5–15.5)
WBC: 6.7 10*3/uL (ref 4.0–10.5)

## 2019-03-14 LAB — URIC ACID: URIC ACID, SERUM: 7.5 mg/dL (ref 4.0–7.8)

## 2019-03-14 NOTE — Progress Notes (Signed)
Benjamin Montgomery. Benjamin Montgomery Sports Medicine [facility] at [facility] [phone]  Benjamin Montgomery - 33 y.o. male MRN [id number]  Date of birth: Jun 30, 1986  Visit Date: 03/14/2019  PCP: Benjamin Noble, MD   Referred by: Benjamin Noble, MD  SUBJECTIVE:  Chief Complaint  Patient presents with  . Left Great Toe - Follow-up    XR at [facility] 01/30/2019. Corticosteroid inj 01/31/2019.     HPI: Patient is here for reevaluation of his left great toe pain.  He is continued to have some intermittent discomfort especially with jumping or running.  He did go trampoline park and had a slight flareup after this.  Has subsequently resolved within 24 hours.  He had good resolution with the prior treatment and is here today for repeat clinical exam and lab check.  Denies any known history of gout or family history of gout.  REVIEW OF SYSTEMS: No significant nighttime awakenings due to this issue. Denies fevers, chills, recent weight gain or weight loss.  No night sweats.  Pt denies any change in bowel or bladder habits, muscle weakness, numbness or falls associated with this pain.  HISTORY:  Prior history reviewed and updated per electronic medical record.  Patient Active Problem List   Diagnosis Date Noted  . Arthritis of foot, right 06/18/2017  . Lisfranc dislocation, right, sequela 06/18/2017   Social History   Occupational History  . Not on file  Tobacco Use  . Smoking status: Never Smoker  . Smokeless tobacco: Current User    Types: Chew  Substance and Sexual Activity  . Alcohol use: Yes    Comment: 3 times a week  . Drug use: No  . Sexual activity: Not on file   Social History   Social History Narrative  . Not on file     OBJECTIVE:  VS:  HT:5\' 11"  (180.3 cm)   WT:231 lb 9.6 oz (105.1 kg)  BMI:32.32    BP:130/84  HR:74bpm  TEMP: ( )  RESP:97 %   PHYSICAL EXAM: Adult male. No acute distress.  Alert and appropriate. Left great toe is overall well aligned.  Is  still slightly swollen but no significant erythema.  He has no pain with great toe motion.  Early bunion formation.   ASSESSMENT:   1. Effusion of joint of toe of left foot   2. Pain of toe of left foot     PROCEDURES:  None  PLAN:  Pertinent additional documentation may be included in corresponding procedure notes, imaging studies, problem based documentation and patient instructions.  No problem-specific Assessment & Plan notes found for this encounter.   Given the presentation I am concerned that this is likely an acute gout flare.  We will plan to check labs and see that his uric acid levels and appropriate baseline level.  May need chronic management with medications if markedly elevated or can consider medications if an acute flareup in the future.  He also does report that he has had some chronic intermittent back pain and foundations videos were recommended for him to try but if any exacerbation of his symptoms he will plan to follow-up for formal evaluation of this.   Activity modifications and the importance of avoiding exacerbating activities (limiting pain to no more than a 4 / 10 during or following activity) recommended and discussed.   Discussed red flag symptoms that warrant earlier emergent evaluation and patient voices understanding.    No orders of the defined types were placed  in this encounter.   Lab Orders     Uric acid     CBC     Comprehensive metabolic panel Imaging Orders  No imaging studies ordered today   Referral Orders  No referral(s) requested today    Return if symptoms worsen or fail to improve.          Benjamin Mews, DO    [place] Sports Medicine Physician

## 2019-03-14 NOTE — Patient Instructions (Signed)

## 2019-03-16 ENCOUNTER — Encounter: Payer: Self-pay | Admitting: Sports Medicine

## 2019-03-16 NOTE — Progress Notes (Signed)
My chart message sent.  Uric Acid levels are within normal limits however 7.5 is high for a baseline level.  I do think that the level of your uric acid should be rechecked in 6-8 weeks and ensure it is continuing to trend down.  We will tend to treat levels greater than 6.5 if there are continued flair-ups.

## 2019-03-17 ENCOUNTER — Other Ambulatory Visit: Payer: Self-pay | Admitting: Sports Medicine

## 2019-03-17 ENCOUNTER — Encounter: Payer: Self-pay | Admitting: Sports Medicine

## 2019-03-17 MED ORDER — PREDNISONE 50 MG PO TABS: 50.0000 mg | ORAL_TABLET | Freq: Every day | ORAL | 0 refills | Status: AC

## 2019-04-07 DIAGNOSIS — M109 Gout, unspecified: Secondary | ICD-10-CM | POA: Diagnosis not present

## 2019-07-13 ENCOUNTER — Encounter: Payer: Self-pay | Admitting: Emergency Medicine

## 2019-07-13 ENCOUNTER — Emergency Department: Payer: Worker's Compensation

## 2019-07-13 ENCOUNTER — Other Ambulatory Visit: Payer: Self-pay

## 2019-07-13 ENCOUNTER — Emergency Department
Admission: EM | Admit: 2019-07-13 | Discharge: 2019-07-13 | Disposition: A | Discharge status: Home / Self Care | Payer: Worker's Compensation | Attending: Emergency Medicine | Admitting: Emergency Medicine

## 2019-07-13 DIAGNOSIS — M24129 Other articular cartilage disorders, unspecified elbow: Secondary | ICD-10-CM

## 2019-07-13 DIAGNOSIS — M249 Joint derangement, unspecified: Secondary | ICD-10-CM

## 2019-07-13 DIAGNOSIS — Y9289 Other specified places as the place of occurrence of the external cause: Secondary | ICD-10-CM | POA: Diagnosis not present

## 2019-07-13 DIAGNOSIS — Y998 Other external cause status: Secondary | ICD-10-CM | POA: Insufficient documentation

## 2019-07-13 DIAGNOSIS — W228XXA Striking against or struck by other objects, initial encounter: Secondary | ICD-10-CM | POA: Insufficient documentation

## 2019-07-13 DIAGNOSIS — Y9389 Activity, other specified: Secondary | ICD-10-CM | POA: Diagnosis not present

## 2019-07-13 DIAGNOSIS — S5002XA Contusion of left elbow, initial encounter: Secondary | ICD-10-CM | POA: Diagnosis not present

## 2019-07-13 DIAGNOSIS — S59902A Unspecified injury of left elbow, initial encounter: Secondary | ICD-10-CM | POA: Diagnosis present

## 2019-07-13 MED ORDER — OXYCODONE-ACETAMINOPHEN 5-325 MG PO TABS
2.0000 | ORAL_TABLET | Freq: Once | ORAL | Status: AC
Start: 2019-07-13 — End: 2019-07-13
  Administered 2019-07-13: 12:00:00 2 via ORAL
  Filled 2019-07-13: qty 2

## 2019-07-13 MED ORDER — OXYCODONE-ACETAMINOPHEN 5-325 MG PO TABS: 1.0000 | ORAL_TABLET | Freq: Four times a day (QID) | ORAL | 0 refills | Status: DC | PRN

## 2019-07-13 NOTE — ED Notes (Signed)
This EDT spoke to Chief Apple of [employer] Department concerning a UDS. Chief Apple denied the need for one.

## 2019-07-13 NOTE — Discharge Instructions (Signed)
Call today to make an appointment for follow-up with Dr. Marry Guan who is the orthopedist on call.  Let them know that this is a Workmen's Comp. injury.  Ice and elevation tonight to reduce swelling.  Continue taking pain medication as directed.  Do not take the pain medication if you plan on driving as it may cause drowsiness and increase your risk for injury.

## 2019-07-13 NOTE — ED Triage Notes (Signed)
Pt presents to ED via POV with c/o L arm injury/pain/swelling. 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 Pt presents with severe L arm swelling above the L elbow, but states is unable to extend.

## 2019-07-13 NOTE — ED Provider Notes (Signed)
[facility] Emergency Department Provider Note  ____________________________________________   First MD Initiated Contact with Patient 07/13/19 1149     (approximate)  I have reviewed the triage vital signs and the nursing notes.   HISTORY  Chief Complaint Arm Pain   HPI Benjamin Montgomery is a 33 y.o. male presents to the ED with complaint of left arm injury this morning while at work.  Patient states he was lowering a extension ladder when the ladder dropped onto his arm.  Patient felt a pop in his left elbow and had immediate pain.  There is swelling to his left arm and patient is unable to extend his arm completely.  He denies any previous injury to his elbow.  He rates his pain as a 8 /10.       History reviewed. No pertinent past medical history.  Patient Active Problem List   Diagnosis Date Noted  . Arthritis of foot, right 06/18/2017  . Lisfranc dislocation, right, sequela 06/18/2017    History reviewed. No pertinent surgical history.  Prior to Admission medications   Medication Sig Start Date End Date Taking? Authorizing Provider  amphetamine-dextroamphetamine (ADDERALL XR) 25 MG 24 hr capsule Take 25 mg by mouth every morning.    [provider]  Glucosamine-Chondroit-Vit C-Mn (GLUCOSAMINE 1500 COMPLEX PO) Take by mouth.    [provider]  ibuprofen (ADVIL,MOTRIN) 800 MG tablet TK 1 T PO BID WF OR MILK PRN 06/16/17   [provider]  indomethacin (INDOCIN) 25 MG capsule Take 25 mg by mouth 3 (three) times daily as needed.    [provider]  omeprazole (PRILOSEC) 10 MG capsule Take 10 mg by mouth daily.    [provider]  oxyCODONE-acetaminophen (PERCOCET) 5-325 MG tablet Take 1-2 tablets by mouth every 6 (six) hours as needed for severe pain. 07/13/19   Tommi RumpsSummers, Laneisha Mino L, PA-C    Allergies Patient has no known allergies.  Family History  Problem Relation Age of Onset  . Hyperlipidemia Mother   .  Hypertension Mother   . Cancer Father     Social History Social History   Tobacco Use  . Smoking status: Never Smoker  . Smokeless tobacco: Current User    Types: Chew  Substance Use Topics  . Alcohol use: Yes    Comment: 3 times a week  . Drug use: No    Review of Systems Constitutional: No fever/chills Eyes: No visual changes. ENT: No sore throat. Cardiovascular: Denies chest pain. Respiratory: Denies shortness of breath. Gastrointestinal: No abdominal pain.  No nausea, no vomiting.   Musculoskeletal: Positive left arm/elbow pain. Skin: Negative for rash. Neurological: Negative for headaches, focal weakness or numbness.  ____________________________________________   PHYSICAL EXAM:  VITAL SIGNS: ED Triage Vitals  Enc Vitals Group     BP 07/13/19 1145 (!) 156/108     Pulse Rate 07/13/19 1145 (!) 104     Resp 07/13/19 1145 16     Temp 07/13/19 1145 99.2 F (37.3 C)     Temp Source 07/13/19 1145 Oral     SpO2 07/13/19 1145 97 %     Weight 07/13/19 1139 225 lb (102.1 kg)     Height 07/13/19 1139 5\' 11"  (1.803 m)     Head Circumference --      Peak Flow --      Pain Score 07/13/19 1139 8     Pain Loc --      Pain Edu? --  Excl. in [place]? --    Constitutional: Alert and oriented. Well appearing and in no acute distress but appears to be very uncomfortable. Eyes: Conjunctivae are normal.  Head: Atraumatic. Nose: No congestion/rhinnorhea. Neck: No stridor.   Cardiovascular: Normal rate, regular rhythm. Grossly normal heart sounds.  Good peripheral circulation. Respiratory: Normal respiratory effort.  No retractions. Lungs CTAB. Musculoskeletal: On examination of the left arm there is marked soft tissue edema present to the left elbow.  Patient is unable to flex or rotate due to severe pain and also inability to extend.  He is markedly tender anteriorly.  Skin is intact.  Pulse present.  Patient is able to move digits distally without any difficulty.  There is no  tenderness noted on palpation of the [facility] joint and proximal humerus. Neurologic:  Normal speech and language. No gross focal neurologic deficits are appreciated. No gait instability. Skin:  Skin is warm, dry and intact.  No ecchymosis or abrasions were seen. Psychiatric: Mood and affect are normal. Speech and behavior are normal.  ____________________________________________   LABS (all labs ordered are listed, but only abnormal results are displayed)  Labs Reviewed - No data to display   RADIOLOGY  ED MD interpretation:   Official radiology report(s): Dg Elbow Complete Left  Result Date: 07/13/2019 CLINICAL DATA:  Left elbow pain and swelling following an injury. EXAM: LEFT ELBOW - COMPLETE 3+ VIEW COMPARISON:  None. FINDINGS: Tiny olecranon spur. Posterior soft tissue swelling. No fracture, dislocation or effusion. IMPRESSION: No fracture. Electronically Signed   By: Claudie Revering M.D.   On: 07/13/2019 12:57    ____________________________________________   PROCEDURES  Procedure(s) performed (including Critical Care):  Procedures  OCL posterior splint was placed on the arm along with a sling for protection and support by the ED tech. ____________________________________________   INITIAL IMPRESSION / ASSESSMENT AND PLAN / ED COURSE  As part of my medical decision making, I reviewed the following data within the electronic MEDICAL RECORD NUMBER Notes from prior ED visits and [place] Controlled Substance Database   33 year old male presents to the ED with a Workmen's Comp. injury which occurred today injuring his left arm.  Patient had his arm caught on a ladder that dropped.  He states he felt a pop to his left elbow.  X-rays of his elbow did not show any acute bony injury however there is marked soft tissue edema and his inability to flex or rotate is suspicious for tendon rupture and patient was made aware.  He is encouraged to ice and elevate tonight and to call Dr. Marry Guan who is  on-call for orthopedics today to make arrangements to be seen.  Patient is out of work until released by the orthopedist.  ____________________________________________   FINAL CLINICAL IMPRESSION(S) / ED DIAGNOSES  Final diagnoses:  Contusion of left elbow, initial encounter  Internal derangement of elbow     ED Discharge Orders         Ordered    oxyCODONE-acetaminophen (PERCOCET) 5-325 MG tablet  Every 6 hours PRN     07/13/19 1328           Note:  This document was prepared using Dragon voice recognition software and may include unintentional dictation errors.    Johnn Hai, PA-C 07/13/19 1602    Earleen Newport, MD 07/14/19 [phone]

## 2019-07-20 ENCOUNTER — Other Ambulatory Visit: Payer: Self-pay

## 2019-07-20 ENCOUNTER — Encounter
Admission: RE | Admit: 2019-07-20 | Discharge: 2019-07-20 | Disposition: A | Discharge status: Home / Self Care | Payer: 59 | Source: Ambulatory Visit | Attending: Orthopedic Surgery | Admitting: Orthopedic Surgery

## 2019-07-20 ENCOUNTER — Other Ambulatory Visit
Admission: RE | Admit: 2019-07-20 | Discharge: 2019-07-20 | Disposition: A | Discharge status: Home / Self Care | Payer: 59 | Source: Ambulatory Visit | Attending: Orthopedic Surgery | Admitting: Orthopedic Surgery

## 2019-07-20 ENCOUNTER — Other Ambulatory Visit: Payer: Self-pay | Admitting: Orthopedic Surgery

## 2019-07-20 DIAGNOSIS — Z1159 Encounter for screening for other viral diseases: Secondary | ICD-10-CM | POA: Diagnosis present

## 2019-07-20 HISTORY — DX: Gastro-esophageal reflux disease without esophagitis: K21.9

## 2019-07-20 LAB — SARS CORONAVIRUS 2 (TAT 6-24 HRS): SARS Coronavirus 2: NEGATIVE

## 2019-07-20 MED ORDER — CEFAZOLIN SODIUM-DEXTROSE 2-4 GM/100ML-% IV SOLN
2.0000 g | INTRAVENOUS | Status: AC
  Administered 2019-07-21: 3 g via INTRAVENOUS

## 2019-07-20 NOTE — Patient Instructions (Signed)
Your procedure is scheduled on: 07-21-19  Report to Same Day Surgery 2nd floor medical mall [place] [facility](Medical Mall Entrance-take elevator on left to 2nd floor.  Check in with surgery information desk.) @ 9:30 am per pt   Remember: Instructions that are not followed completely may result in serious medical risk, up to and including death, or upon the discretion of your surgeon and anesthesiologist your surgery may need to be rescheduled.    _x___ 1. Do not eat food after midnight the night before your procedure. You may drink clear liquids up to 2 hours before you are scheduled to arrive at the hospital for your procedure.  Do not drink clear liquids within 2 hours of your scheduled arrival to the hospital.  Clear liquids include  --Water or Apple juice without pulp  --Clear carbohydrate beverage such as ClearFast or Gatorade  --Black Coffee or Clear Tea (No milk, no creamers, do not add anything to the coffee or Tea   ____Ensure clear carbohydrate drink on the way to the hospital for bariatric patients  ____Ensure clear carbohydrate drink 3 hours before surgery.   No gum chewing or hard candies.     __x__ 2. No Alcohol for 24 hours before or after surgery.   __x__3. No Smoking or e-cigarettes for 24 prior to surgery.  Do not use any chewable tobacco products for at least 6 hour prior to surgery   ____  4. Bring all medications with you on the day of surgery if instructed.    __x__ 5. Notify your doctor if there is any change in your medical condition     (cold, fever, infections).    x___6. On the morning of surgery brush your teeth with toothpaste and water.  You may rinse your mouth with mouth wash if you wish.  Do not swallow any toothpaste or mouthwash.   Do not wear jewelry, make-up, hairpins, clips or nail polish.  Do not wear lotions, powders, or perfumes. You may wear deodorant.  Do not shave 48 hours prior to surgery. Men may shave face and neck.  Do not bring valuables to the  hospital.    [facility] is not responsible for any belongings or valuables.               Contacts, dentures or bridgework may not be worn into surgery.  Leave your suitcase in the car. After surgery it may be brought to your room.  For patients admitted to the hospital, discharge time is determined by your  treatment team.  _  Patients discharged the day of surgery will not be allowed to drive home.  You will need someone to drive you home and stay with you the night of your procedure.    Please read over the following fact sheets that you were given:   [facility] Preparing for Surgery and or MRSA Information   _x___ Take anti-hypertensive listed below, cardiac, seizure, asthma, anti-reflux and psychiatric medicines. These include:  1. YOU MAY TAKE PERCOCET AM OF SURGERY IF NEEDED WITH A SMALL SIP OF WATER  2.  3.  4.  5.  6.  ____Fleets enema or Magnesium Citrate as directed.   ____ Use CHG Soap or sage wipes as directed on instruction sheet   ____ Use inhalers on the day of surgery and bring to hospital day of surgery  ____ Stop Metformin and Janumet 2 days prior to surgery.    ____ Take 1/2 of usual insulin dose the night before surgery  and none on the morning surgery.   ____ Follow recommendations from Cardiologist, Pulmonologist or PCP regarding stopping Aspirin, Coumadin, Plavix ,Eliquis, Effient, or Pradaxa, and Pletal.  X____Stop Anti-inflammatories such as Advil, Aleve, Ibuprofen, Motrin, Naproxen, Naprosyn, Goodies powders or aspirin products NOW- OK to take Tylenol    ____ Stop supplements until after surgery-ALREADY STOPPED GLUCOSAMINE-CHONDROITIN    ____ Bring C-Pap to the hospital.

## 2019-07-21 ENCOUNTER — Ambulatory Visit: Payer: Worker's Compensation | Admitting: Anesthesiology

## 2019-07-21 ENCOUNTER — Encounter: Payer: Self-pay | Admitting: *Deleted

## 2019-07-21 ENCOUNTER — Encounter
Admission: RE | Disposition: A | Discharge status: Home / Self Care | Payer: Self-pay | Source: Ambulatory Visit | Attending: Orthopedic Surgery

## 2019-07-21 ENCOUNTER — Other Ambulatory Visit: Payer: Self-pay

## 2019-07-21 ENCOUNTER — Ambulatory Visit
Admission: RE | Admit: 2019-07-21 | Discharge: 2019-07-21 | Disposition: A | Discharge status: Home / Self Care | Payer: Worker's Compensation | Source: Ambulatory Visit | Attending: Orthopedic Surgery | Admitting: Orthopedic Surgery

## 2019-07-21 DIAGNOSIS — X58XXXA Exposure to other specified factors, initial encounter: Secondary | ICD-10-CM | POA: Insufficient documentation

## 2019-07-21 DIAGNOSIS — S46312A Strain of muscle, fascia and tendon of triceps, left arm, initial encounter: Secondary | ICD-10-CM | POA: Diagnosis present

## 2019-07-21 HISTORY — PX: TRICEPS TENDON REPAIR: SHX2577

## 2019-07-21 LAB — BASIC METABOLIC PANEL
Anion gap: 7 (ref 5–15)
BUN: 16 mg/dL (ref 6–20)
CO2: 26 mmol/L (ref 22–32)
Calcium: 9.4 mg/dL (ref 8.9–10.3)
Chloride: 106 mmol/L (ref 98–111)
Creatinine, Ser: 0.99 mg/dL (ref 0.61–1.24)
GFR calc Af Amer: >60 mL/min (ref >60)
GFR calc non Af Amer: >60 mL/min (ref >60)
Glucose, Bld: 100 mg/dL — ABNORMAL HIGH (ref 70–99)
Potassium: 4.2 mmol/L (ref 3.5–5.1)
Sodium: 139 mmol/L (ref 135–145)

## 2019-07-21 LAB — CBC WITH DIFFERENTIAL/PLATELET
Abs Immature Granulocytes: 0.05 10*3/uL (ref 0.00–0.07)
Basophils Absolute: 0 10*3/uL (ref 0.0–0.1)
Basophils Relative: 0 %
Eosinophils Absolute: 0.3 10*3/uL (ref 0.0–0.5)
Eosinophils Relative: 5 %
HCT: 40.8 % (ref 39.0–52.0)
Hemoglobin: 14 g/dL (ref 13.0–17.0)
Immature Granulocytes: 1 %
Lymphocytes Relative: 31 %
Lymphs Abs: 2 10*3/uL (ref 0.7–4.0)
MCH: 30.2 pg (ref 26.0–34.0)
MCHC: 34.3 g/dL (ref 30.0–36.0)
MCV: 88.1 fL (ref 80.0–100.0)
Monocytes Absolute: 0.5 10*3/uL (ref 0.1–1.0)
Monocytes Relative: 8 %
Neutro Abs: 3.5 10*3/uL (ref 1.7–7.7)
Neutrophils Relative %: 55 %
Platelets: 230 10*3/uL (ref 150–400)
RBC: 4.63 MIL/uL (ref 4.22–5.81)
RDW: 12.6 % (ref 11.5–15.5)
WBC: 6.5 10*3/uL (ref 4.0–10.5)
nRBC: 0 % (ref 0.0–0.2)

## 2019-07-21 LAB — PROTIME-INR
INR: 1 (ref 0.8–1.2)
Prothrombin Time: 12.8 seconds (ref 11.4–15.2)

## 2019-07-21 LAB — APTT: aPTT: 33 seconds (ref 24–36)

## 2019-07-21 SURGERY — REPAIR, TENDON, TRICEPS
Anesthesia: General | Site: Arm Upper | Laterality: Left

## 2019-07-21 MED ORDER — CHLORHEXIDINE GLUCONATE CLOTH 2 % EX PADS
6.0000 | MEDICATED_PAD | Freq: Once | CUTANEOUS | Status: AC
  Administered 2019-07-21: 6 via TOPICAL

## 2019-07-21 MED ORDER — ONDANSETRON HCL 4 MG/2ML IJ SOLN
INTRAMUSCULAR | Status: AC
  Filled 2019-07-21: qty 2

## 2019-07-21 MED ORDER — ONDANSETRON HCL 4 MG/2ML IJ SOLN: 4.0000 mg | Freq: Once | INTRAMUSCULAR | Status: DC | PRN

## 2019-07-21 MED ORDER — SEVOFLURANE IN SOLN
RESPIRATORY_TRACT | Status: AC
  Filled 2019-07-21: qty 250

## 2019-07-21 MED ORDER — FENTANYL CITRATE (PF) 100 MCG/2ML IJ SOLN
25.0000 ug | INTRAMUSCULAR | Status: AC | PRN
  Administered 2019-07-21 (×6): 25 ug via INTRAVENOUS

## 2019-07-21 MED ORDER — PROPOFOL 10 MG/ML IV BOLUS
INTRAVENOUS | Status: AC
  Filled 2019-07-21: qty 20

## 2019-07-21 MED ORDER — CHLORHEXIDINE GLUCONATE CLOTH 2 % EX PADS: 6.0000 | MEDICATED_PAD | Freq: Once | CUTANEOUS | Status: DC

## 2019-07-21 MED ORDER — KETOROLAC TROMETHAMINE 30 MG/ML IJ SOLN
INTRAMUSCULAR | Status: AC
  Administered 2019-07-21: 30 mg via INTRAVENOUS
  Filled 2019-07-21: qty 1

## 2019-07-21 MED ORDER — FENTANYL CITRATE (PF) 100 MCG/2ML IJ SOLN
INTRAMUSCULAR | Status: AC
  Administered 2019-07-21: 25 ug via INTRAVENOUS
  Filled 2019-07-21: qty 2

## 2019-07-21 MED ORDER — LACTATED RINGERS IV SOLN
INTRAVENOUS | Status: DC
  Administered 2019-07-21: 11:00:00 via INTRAVENOUS

## 2019-07-21 MED ORDER — MIDAZOLAM HCL 2 MG/2ML IJ SOLN
INTRAMUSCULAR | Status: AC
  Filled 2019-07-21: qty 2

## 2019-07-21 MED ORDER — FENTANYL CITRATE (PF) 250 MCG/5ML IJ SOLN
INTRAMUSCULAR | Status: AC
  Filled 2019-07-21: qty 5

## 2019-07-21 MED ORDER — KETOROLAC TROMETHAMINE 30 MG/ML IJ SOLN
30.0000 mg | Freq: Once | INTRAMUSCULAR | Status: AC
  Administered 2019-07-21: 30 mg via INTRAVENOUS

## 2019-07-21 MED ORDER — DEXAMETHASONE SODIUM PHOSPHATE 10 MG/ML IJ SOLN
INTRAMUSCULAR | Status: DC | PRN
  Administered 2019-07-21: 10 mg via INTRAVENOUS

## 2019-07-21 MED ORDER — OXYCODONE HCL 5 MG PO TABS
5.0000 mg | ORAL_TABLET | Freq: Once | ORAL | Status: AC
  Administered 2019-07-21: 5 mg via ORAL
  Filled 2019-07-21: qty 1

## 2019-07-21 MED ORDER — PROPOFOL 10 MG/ML IV BOLUS
INTRAVENOUS | Status: DC | PRN
  Administered 2019-07-21: 30 mg via INTRAVENOUS
  Administered 2019-07-21: 200 mg via INTRAVENOUS

## 2019-07-21 MED ORDER — DEXMEDETOMIDINE HCL 200 MCG/2ML IV SOLN
INTRAVENOUS | Status: DC | PRN
  Administered 2019-07-21 (×2): 16 ug via INTRAVENOUS

## 2019-07-21 MED ORDER — FENTANYL CITRATE (PF) 100 MCG/2ML IJ SOLN
INTRAMUSCULAR | Status: DC | PRN
  Administered 2019-07-21: 100 ug via INTRAVENOUS
  Administered 2019-07-21 (×3): 50 ug via INTRAVENOUS

## 2019-07-21 MED ORDER — LACTATED RINGERS IV SOLN
INTRAVENOUS | Status: DC | PRN
  Administered 2019-07-21: 13:00:00 via INTRAVENOUS

## 2019-07-21 MED ORDER — OXYCODONE HCL 5 MG PO TABS: 5.0000 mg | ORAL_TABLET | ORAL | 0 refills | Status: DC | PRN

## 2019-07-21 MED ORDER — CEFAZOLIN SODIUM-DEXTROSE 2-4 GM/100ML-% IV SOLN
INTRAVENOUS | Status: AC
  Filled 2019-07-21: qty 100

## 2019-07-21 MED ORDER — ACETAMINOPHEN 10 MG/ML IV SOLN
INTRAVENOUS | Status: DC | PRN
  Administered 2019-07-21: 1000 mg via INTRAVENOUS

## 2019-07-21 MED ORDER — OXYCODONE HCL 5 MG PO TABS
ORAL_TABLET | ORAL | Status: AC
  Filled 2019-07-21: qty 1

## 2019-07-21 MED ORDER — FAMOTIDINE 20 MG PO TABS
20.0000 mg | ORAL_TABLET | Freq: Once | ORAL | Status: AC
  Administered 2019-07-21: 20 mg via ORAL

## 2019-07-21 MED ORDER — ACETAMINOPHEN 10 MG/ML IV SOLN
INTRAVENOUS | Status: AC
  Filled 2019-07-21: qty 100

## 2019-07-21 MED ORDER — LIDOCAINE HCL (CARDIAC) PF 100 MG/5ML IV SOSY
PREFILLED_SYRINGE | INTRAVENOUS | Status: DC | PRN
  Administered 2019-07-21: 100 mg via INTRAVENOUS

## 2019-07-21 MED ORDER — BUPIVACAINE HCL (PF) 0.25 % IJ SOLN
INTRAMUSCULAR | Status: AC
  Filled 2019-07-21: qty 30

## 2019-07-21 MED ORDER — HYDROMORPHONE HCL 1 MG/ML IJ SOLN
INTRAMUSCULAR | Status: AC
  Filled 2019-07-21: qty 1

## 2019-07-21 MED ORDER — ONDANSETRON HCL 4 MG PO TABS: 4.0000 mg | ORAL_TABLET | Freq: Three times a day (TID) | ORAL | 0 refills | Status: DC | PRN

## 2019-07-21 MED ORDER — FENTANYL CITRATE (PF) 100 MCG/2ML IJ SOLN
25.0000 ug | INTRAMUSCULAR | Status: AC | PRN
  Administered 2019-07-21 (×2): 25 ug via INTRAVENOUS

## 2019-07-21 MED ORDER — NEOMYCIN-POLYMYXIN B GU 40-200000 IR SOLN
Status: DC | PRN
  Administered 2019-07-21: 4 mL

## 2019-07-21 MED ORDER — FAMOTIDINE 20 MG PO TABS
ORAL_TABLET | ORAL | Status: AC
  Filled 2019-07-21: qty 1

## 2019-07-21 MED ORDER — MIDAZOLAM HCL 2 MG/2ML IJ SOLN
INTRAMUSCULAR | Status: DC | PRN
  Administered 2019-07-21: 2 mg via INTRAVENOUS

## 2019-07-21 MED ORDER — BUPIVACAINE HCL (PF) 0.25 % IJ SOLN
INTRAMUSCULAR | Status: DC | PRN
  Administered 2019-07-21: 30 mL

## 2019-07-21 SURGICAL SUPPLY — 57 items
ANCHOR ALL-SUT Q-FIX 1.8 BLUE (Anchor) ×9 IMPLANT
ANCHOR SUT 5.5 MULTIFIX (Orthopedic Implant) ×6 IMPLANT
ANCHOR SUT 5.5MM MULTIFIX (Orthopedic Implant) ×3 IMPLANT
BLADE SURG SZ10 CARB STEEL (BLADE) ×6 IMPLANT
BNDG COHESIVE 4X5 TAN STRL (GAUZE/BANDAGES/DRESSINGS) ×6 IMPLANT
BNDG ELASTIC 4X5.8 VLCR STR LF (GAUZE/BANDAGES/DRESSINGS) ×3 IMPLANT
BNDG ESMARK 4X12 TAN STRL LF (GAUZE/BANDAGES/DRESSINGS) ×6 IMPLANT
BUR 4.8X51.2 (BURR) IMPLANT
CANISTER SUCT 1200ML W/VALVE (MISCELLANEOUS) ×3 IMPLANT
COVER WAND RF STERILE (DRAPES) ×3 IMPLANT
CUFF DUAL TOURNIQUET 18IN DISP (TOURNIQUET CUFF) IMPLANT
CUFF TOURN 24 STER (MISCELLANEOUS) ×3 IMPLANT
DRAPE FLUOR MINI C-ARM 54X84 (DRAPES) ×3 IMPLANT
DURAPREP 26ML APPLICATOR (WOUND CARE) ×9 IMPLANT
ELECT CAUTERY BLADE 6.4 (BLADE) ×3 IMPLANT
ELECT REM PT RETURN 9FT ADLT (ELECTROSURGICAL) ×3
ELECTRODE REM PT RTRN 9FT ADLT (ELECTROSURGICAL) ×1 IMPLANT
GAUZE SPONGE 4X4 12PLY STRL (GAUZE/BANDAGES/DRESSINGS) ×3 IMPLANT
GAUZE XEROFORM 1X8 LF (GAUZE/BANDAGES/DRESSINGS) ×3 IMPLANT
GLOVE BIOGEL PI IND STRL 9 (GLOVE) ×1 IMPLANT
GLOVE BIOGEL PI INDICATOR 9 (GLOVE) ×2
GLOVE SURG 9.0 ORTHO LTXF (GLOVE) ×6 IMPLANT
GOWN STRL REUS TWL 2XL XL LVL4 (GOWN DISPOSABLE) ×3 IMPLANT
GOWN STRL REUS W/ TWL LRG LVL3 (GOWN DISPOSABLE) ×1 IMPLANT
GOWN STRL REUS W/TWL LRG LVL3 (GOWN DISPOSABLE) ×2
HANDLE YANKAUER SUCT BULB TIP (MISCELLANEOUS) ×3 IMPLANT
KIT SUTURE 1.8 Q-FIX DISP (KITS) ×3 IMPLANT
KIT TURNOVER KIT A (KITS) ×3 IMPLANT
LOOP RED MAXI  1X406MM (MISCELLANEOUS)
LOOP VESSEL MAXI 1X406 RED (MISCELLANEOUS) IMPLANT
NDL MAYO CATGUT SZ5 (NEEDLE) ×2
NDL SUT 5 .5 CRC TPR PNT MAYO (NEEDLE) ×1 IMPLANT
NEEDLE FILTER BLUNT 18X 1/2SAF (NEEDLE) ×2
NEEDLE FILTER BLUNT 18X1 1/2 (NEEDLE) ×1 IMPLANT
NS IRRIG 500ML POUR BTL (IV SOLUTION) ×3 IMPLANT
PACK EXTREMITY ARMC (MISCELLANEOUS) ×3 IMPLANT
PAD ABD DERMACEA PRESS 5X9 (GAUZE/BANDAGES/DRESSINGS) ×9 IMPLANT
PADDING CAST BLEND 4X4 NS (MISCELLANEOUS) ×3 IMPLANT
PASSER SUT SWANSON 36MM LOOP (INSTRUMENTS) IMPLANT
SLING ARM XL TX990206 (SOFTGOODS) ×3 IMPLANT
SPLINT CAST 1 STEP 4X30 (MISCELLANEOUS) IMPLANT
SPONGE LAP 18X18 RF (DISPOSABLE) ×9 IMPLANT
STAPLER SKIN PROX 35W (STAPLE) ×3 IMPLANT
STOCKINETTE IMPERVIOUS 9X36 MD (GAUZE/BANDAGES/DRESSINGS) ×3 IMPLANT
SUT FIBERWIRE #2 38 BLUE 1/2 (SUTURE) ×6
SUT FIBERWIRE #2 38 T-5 BLUE (SUTURE)
SUT FIBERWIRE #5 38 BLUE (WIRE) IMPLANT
SUT VIC AB 0 CT1 36 (SUTURE) ×6 IMPLANT
SUT VIC AB 2-0 CT1 27 (SUTURE) ×2
SUT VIC AB 2-0 CT1 TAPERPNT 27 (SUTURE) ×1 IMPLANT
SUT VIC AB 3-0 SH 27 (SUTURE) ×2
SUT VIC AB 3-0 SH 27X BRD (SUTURE) ×1 IMPLANT
SUTURE FIBERWR #2 38 BLUE 1/2 (SUTURE) ×2 IMPLANT
SUTURE FIBERWR #2 38 T-5 BLUE (SUTURE) IMPLANT
SYR 30ML LL (SYRINGE) ×3 IMPLANT
SYR 5ML LL (SYRINGE) ×3 IMPLANT
TOWEL OR 17X26 4PK STRL BLUE (TOWEL DISPOSABLE) ×18 IMPLANT

## 2019-07-21 NOTE — H&P (Signed)
PREOPERATIVE H&P  Chief Complaint: Left elbow high-grade partial triceps tendon tear  HPI: Benjamin Montgomery is a 33 y.o. male who presents for preoperative history and physical with a diagnosis of left elbow partial triceps tendon tear.  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  A left elbow MRI confirmed a high-grade partial triceps tendon tear.  He has agreed with plan for surgical repair of the torn triceps tendon.   Past Medical History:  Diagnosis Date  . GERD (gastroesophageal reflux disease)    occ- no meds   Past Surgical History:  Procedure Laterality Date  . NO PAST SURGERIES     Social History   Socioeconomic History  . Marital status: Married    Spouse name: Not on file  . Number of children: Not on file  . Years of education: Not on file  . Highest education level: Not on file  Occupational History  . Not on file  Social Needs  . Financial resource strain: Not on file  . Food insecurity    Worry: Not on file    Inability: Not on file  . Transportation needs    Medical: Not on file    Non-medical: Not on file  Tobacco Use  . Smoking status: Never Smoker  . Smokeless tobacco: Current User    Types: Chew  Substance and Sexual Activity  . Alcohol use: Yes    Comment: 3 times a week  . Drug use: No  . Sexual activity: Not on file  Lifestyle  . Physical activity    Days per week: Not on file    Minutes per session: Not on file  . Stress: Not on file  Relationships  . Social Musicianconnections    Talks on phone: Not on file    Gets together: Not on file    Attends religious service: Not on file    Active member of club or organization: Not on file    Attends meetings of clubs or organizations: Not on file    Relationship status: Not on file  Other Topics Concern  . Not on file  Social History Narrative  . Not on file   Family History   Problem Relation Age of Onset  . Hyperlipidemia Mother   . Hypertension Mother   . Cancer Father    No Known Allergies Prior to Admission medications   Medication Sig Start Date End Date Taking? Authorizing Provider  acetaminophen (TYLENOL) 500 MG tablet Take 1,000 mg by mouth every 6 (six) hours as needed for moderate pain or headache.   Yes [provider]  amphetamine-dextroamphetamine (ADDERALL XR) 25 MG 24 hr capsule Take 25 mg by mouth daily as needed (work).    Yes [provider]  diphenhydrAMINE HCl, Sleep, (UNISOM SLEEPGELS) 50 MG CAPS Take 50 mg by mouth at bedtime as needed (sleep).   Yes [provider]  Glucosamine-Chondroit-Vit C-Mn (GLUCOSAMINE 1500 COMPLEX PO) Take 2 tablets by mouth daily.    Yes [provider]  oxyCODONE-acetaminophen (PERCOCET) 5-325 MG tablet Take 1-2 tablets by mouth every 6 (six) hours as needed for severe pain. 07/13/19  Yes Bridget HartshornSummers, Rhonda L, PA-C     Positive ROS: All other systems have been reviewed and were otherwise negative with the exception of those mentioned in the HPI and as above.  Physical Exam: General: Alert, no acute distress Cardiovascular: Regular rate  and rhythm, no murmurs rubs or gallops.  No pedal edema Respiratory: Clear to auscultation bilaterally, no wheezes rales or rhonchi. No cyanosis, no use of accessory musculature GI: No organomegaly, abdomen is soft and non-tender nondistended with positive bowel sounds. Skin: Skin intact, no lesions within the operative field. Neurologic: Sensation intact distally Psychiatric: Patient is competent for consent with normal mood and affect Lymphatic: No cervical lymphadenopathy  MUSCULOSKELETAL: Left elbow: Patient has significant ecchymosis over the medial lateral aspect of the elbow.  He has palpable defect over the lateral aspect of the triceps tendon.  He has weakness to elbow extension.  Is neurovascular intact.  There is no ligamentous laxity  to varus or valgus stress testing of the elbow.  Assessment: Left elbow high-grade partial-thickness tear of the triceps tendon  Plan: Plan for Procedure(s): OPEN TRICEPS TENDON REPAIR, LEFT ELBOW  I reviewed the details of the operation as well as the postoperative course with the patient.  I have also discussed the risks and benefits of surgery.  He understands the risks include but are not limited to infection, bleeding, nerve or blood vessel injury, joint stiffness or loss of motion, persistent pain, weakness or instability, re-tear of the triceps tendon and failure of the repair and hardware failure and the need for further surgery. Medical risks include but are not limited to DVT and pulmonary embolism, myocardial infarction, stroke, pneumonia, respiratory failure and death. Patient understood these risks and wished to proceed.    Thornton Park, MD   07/21/2019 12:44 PM

## 2019-07-21 NOTE — Discharge Instructions (Signed)
AMBULATORY SURGERY  DISCHARGE INSTRUCTIONS   1) The drugs that you were given will stay in your system until tomorrow so for the next 24 hours you should not:  A) Drive an automobile B) Make any legal decisions C) Drink any alcoholic beverage   2) You may resume regular meals tomorrow.  Today it is better to start with liquids and gradually work up to solid foods.  You may eat anything you prefer, but it is better to start with liquids, then soup and crackers, and gradually work up to solid foods.   3) Please notify your doctor immediately if you have any unusual bleeding, trouble breathing, redness and pain at the surgery site, drainage, fever, or pain not relieved by medication.    4) Additional Instructions: keep post op appt that's already been made    Pt can take 1 to 2 tablets of Oxycodone every 4 hours , Tylenol 325 every 4 hours        Please contact your physician with any problems or Same Day Surgery at [phone], Monday through Friday 6 am to 4 pm, or [place] at [facility] number at [phone].

## 2019-07-21 NOTE — Anesthesia Preprocedure Evaluation (Addendum)
Anesthesia Evaluation  Patient identified by MRN, date of birth, ID band Patient awake    Reviewed: Allergy & Precautions, H&P , NPO status , Patient's Chart, lab work & pertinent test results, reviewed documented beta blocker date and time   Airway Mallampati: II  TM Distance: >3 FB Neck ROM: full    Dental  (+) Teeth Intact   Pulmonary neg pulmonary ROS, Patient abstained from smoking.,    Pulmonary exam normal        Cardiovascular negative cardio ROS Normal cardiovascular exam Rhythm:regular Rate:Normal     Neuro/Psych negative neurological ROS  negative psych ROS   GI/Hepatic Neg liver ROS, GERD  Medicated,  Endo/Other  negative endocrine ROS  Renal/GU negative Renal ROS  negative genitourinary   Musculoskeletal   Abdominal   Peds  Hematology negative hematology ROS (+)   Anesthesia Other Findings Past Medical History: No date: GERD (gastroesophageal reflux disease)     Comment:  occ- no meds Past Surgical History: No date: NO PAST SURGERIES 07/21/2019: TRICEPS TENDON REPAIR; Left     Comment:  Procedure: TRICEPS TENDON REPAIR;  Surgeon: Thornton Park, MD;  Location: [facility] ORS;  Service: Orthopedics;                Laterality: Left; BMI    Body Mass Index: 31.38 kg/m     Reproductive/Obstetrics negative OB ROS                             Anesthesia Physical Anesthesia Plan  ASA: II  Anesthesia Plan: General ETT   Post-op Pain Management:    Induction:   PONV Risk Score and Plan:   Airway Management Planned:   Additional Equipment:   Intra-op Plan:   Post-operative Plan:   Informed Consent: I have reviewed the patients History and Physical, chart, labs and discussed the procedure including the risks, benefits and alternatives for the proposed anesthesia with the patient or authorized representative who has indicated his/her understanding and  acceptance.     Dental Advisory Given  Plan Discussed with: CRNA  Anesthesia Plan Comments:         Anesthesia Quick Evaluation

## 2019-07-21 NOTE — Anesthesia Procedure Notes (Signed)
Procedure Name: LMA Insertion Date/Time: 07/21/2019 12:56 PM Performed by: Justus Memory, CRNA Pre-anesthesia Checklist: Patient identified, Patient being monitored, Timeout performed, Emergency Drugs available and Suction available Patient Re-evaluated:Patient Re-evaluated prior to induction Oxygen Delivery Method: Circle system utilized Preoxygenation: Pre-oxygenation with 100% oxygen Induction Type: IV induction Ventilation: Mask ventilation without difficulty LMA: LMA inserted LMA Size: 4.5 Tube type: Oral Number of attempts: 1 Placement Confirmation: positive ETCO2 and breath sounds checked- equal and bilateral Tube secured with: Tape Dental Injury: Teeth and Oropharynx as per pre-operative assessment

## 2019-07-21 NOTE — Transfer of Care (Signed)
Immediate Anesthesia Transfer of Care Note  Patient: Benjamin Montgomery  Procedure(s) Performed: TRICEPS TENDON REPAIR (Left Arm Upper)  Patient Location: PACU  Anesthesia Type:General  Level of Consciousness: awake, drowsy and patient cooperative  Airway & Oxygen Therapy: Patient Spontanous Breathing and Patient connected to face mask oxygen  Post-op Assessment: Report given to RN and Post -op Vital signs reviewed and stable  Post vital signs: Reviewed and stable  Last Vitals:  Vitals Value Taken Time  BP 140/96 07/21/19 1553  Temp    Pulse 86 07/21/19 1554  Resp 16 07/21/19 1554  SpO2 100 % 07/21/19 1554  Vitals shown include unvalidated device data.  Last Pain:  Vitals:   07/21/19 1002  TempSrc: Oral  PainSc: 7          Complications: No apparent anesthesia complications

## 2019-07-21 NOTE — Progress Notes (Signed)
Pt states pain is a 4 but wants meds for ride home

## 2019-07-21 NOTE — Anesthesia Post-op Follow-up Note (Signed)
Anesthesia QCDR form completed.        

## 2019-07-21 NOTE — Anesthesia Postprocedure Evaluation (Signed)
Anesthesia Post Note  Patient: Benjamin Montgomery  Procedure(s) Performed: TRICEPS TENDON REPAIR (Left Arm Upper)  Patient location during evaluation: PACU Anesthesia Type: General Level of consciousness: awake and alert Pain management: pain level controlled Vital Signs Assessment: post-procedure vital signs reviewed and stable Respiratory status: spontaneous breathing, nonlabored ventilation, respiratory function stable and patient connected to nasal cannula oxygen Cardiovascular status: blood pressure returned to baseline and stable Postop Assessment: no apparent nausea or vomiting Anesthetic complications: no     Last Vitals:  Vitals:   07/21/19 1721 07/21/19 1747  BP: (!) 155/96 (!) 162/97  Pulse: 73   Resp: 18 20  Temp: (!) 36.3 C   SpO2: 99% 98%    Last Pain:  Vitals:   07/21/19 1721  TempSrc: Temporal  PainSc: [place]

## 2019-07-21 NOTE — Anesthesia Postprocedure Evaluation (Signed)
Anesthesia Post Note  Patient: Benjamin Montgomery  Procedure(Montgomery) Performed: TRICEPS TENDON REPAIR (Left Arm Upper)  Patient location during evaluation: PACU Anesthesia Type: General Level of consciousness: awake and alert Pain management: pain level controlled Vital Signs Assessment: post-procedure vital signs reviewed and stable Respiratory status: spontaneous breathing, nonlabored ventilation, respiratory function stable and patient connected to nasal cannula oxygen Cardiovascular status: blood pressure returned to baseline and stable Postop Assessment: no apparent nausea or vomiting Anesthetic complications: no     Last Vitals:  Vitals:   07/21/19 1721 07/21/19 1747  BP: (!) 155/96 (!) 162/97  Pulse: 73   Resp: 18 20  Temp: (!) 36.3 C   SpO2: 99% 98%    Last Pain:  Vitals:   07/21/19 1721  TempSrc: Temporal  PainSc: 4                  Benjamin Montgomery     

## 2019-07-22 ENCOUNTER — Encounter: Payer: Self-pay | Admitting: Orthopedic Surgery

## 2019-07-28 DIAGNOSIS — M25519 Pain in unspecified shoulder: Secondary | ICD-10-CM | POA: Insufficient documentation

## 2019-08-02 NOTE — Op Note (Signed)
07/21/2019  3:37 PM  PATIENT:  Benjamin Montgomery    PRE-OPERATIVE DIAGNOSIS:  Left elbow triceps rupture  POST-OPERATIVE DIAGNOSIS:  Same  PROCEDURE:  OPEN LEFT ELBOW TRICEPS TENDON REPAIR  SURGEON:  Thornton Park, MD  ANESTHESIA:   General  PREOPERATIVE INDICATIONS:  Benjamin Montgomery is a  33 y.o. male with a diagnosis of left triceps tendon rupture confirmed by MRI after an injury at work while taking down a ladder.    I discussed the risks and benefits of surgery. The risks include but are not limited to infection, bleeding, nerve or blood vessel injury, joint stiffness or loss of motion, persistent pain, weakness or instability, These risks include but are not limited to infection, bleeding, nerve or blood vessel injury, wound dehiscence, rerupture of the triceps, elbow stiffness, persistent pain, failure of the repair and the need for further surgery. and the need for further surgery. Medical risks include but are not limited to DVT and pulmonary embolism, myocardial infarction, stroke, pneumonia, respiratory failure and death. Patient understood these risks and wished to proceed.   OPERATIVE IMPLANTS: Smith [employer] Nep[employer] Q fix anchors x 2 and Smith and Nephew Multifix anchors x 2  OPERATIVE FINDINGS: Patient had extensive tear of the distal triceps tendon.  This was an intratendinous injury with triceps tendon remaining on the posterior olecranon.  Deep muscle fibers of the triceps were still attached to the olecranon.  The superficial aspect of the triceps tendon was extensively frayed in the more superficial aspect of the triceps was retracted.  OPERATIVE PROCEDURE: Patient was met in the preoperative area.  A preop history and physical was performed at the bedside.  I reviewed the details of the operation as well as the postoperative course with the patient.  Patient was brought to the operating room where he was placed in a soft lateral position with a beanbag.  An axillary roll was placed under the  patient and his right common peroneal nerve was protected with pillows under the right lower extremity.  Patient was prepped and draped in sterile fashion.  A timeout was performed to verify the patient's name, date of birth, medical record number, correct site of surgery and correct procedure to be performed.  Once all in attendance were in agreement the case began.  Patient received 2 g of Kefzol prior to the onset of the case.  A sterile tourniquet was initially inflated to 250 mmHg.  A posterior incision was made curving lateral to the tip of the olecranon.  Full-thickness skin flaps were developed.  A large hematoma was evacuated.  The distal triceps tendon was found to be retracted and significantly frayed.  The deep fibers of the triceps appeared to still be in attached to the posterior olecranon.  There was triceps tendon covering the posterior olecranon as well.  The triceps tear appeared to be intratendinous, and not an avulsion off the posterior olecranon.    Two #2 FiberWire sutures were used in a Krakw fashion to allow for reduction of the triceps tendon.  Traction was applied and manual dissection to remove scar tissue and adhesions was performed.  The tourniquet was deflated to allow for better reduction of the triceps.  All bleeding vessels were cauterized.  2 Smith [employer] Nephew Q fix anchors were then inserted into the posterior olecranon.  FluoroScan imaging was used to confirm that these anchors did not penetrate the elbow joint.  The sutures were then passed through the distal triceps.    The #2  FiberWire sutures were then loaded into a [employer] fix anchor.  This anchor was placed into the posterior aspect of the proximal olecranon.  A hole was predrilled for the anchor and its position was confirmed on FluoroScan imaging to ensure it did not penetrate the elbow joint.  The FiberWire sutures were manually tensioned to allow for reduction of the triceps tendon.  The suture limbs from  the 2Q fix anchors were then tied down.  The remaining suture limbs were placed into a multi fix anchor which was placed approximately 5 to 10 mm distal to the first multi fix anchor.  Its position again was confirmed on FluoroScan imaging.  After placement of both multi fix anchors the triceps tendon was reduced to an anatomic position.  The elbow joint was copiously irrigated.  The subcutaneous tissue was closed with both 0 and 2-0 Vicryl and the skin approximated with staples.  A dry sterile dressing was applied along with a posterior splint with the elbow position approximately 90 degrees of flexion.  The patient was placed in a sling.  He was awoken and brought to the PACU in stable condition.  I was scrubbed and present for the entire case and all sharp, sponge and instrument counts were correct at the conclusion the case.  I spoke with patient's wife in the postop consultation room to let her know that surgery had been performed without complication and her husband are stable in the recovery room.

## 2019-12-20 ENCOUNTER — Ambulatory Visit: Payer: 59 | Attending: Internal Medicine

## 2019-12-20 DIAGNOSIS — Z20822 Contact with and (suspected) exposure to covid-19: Secondary | ICD-10-CM

## 2019-12-21 LAB — NOVEL CORONAVIRUS, NAA: SARS-CoV-2, NAA: NOT DETECTED

## 2020-03-28 IMAGING — DX LEFT ELBOW - COMPLETE 3+ VIEW
4 series · 4 of 4 positions shown · non-contrast
Comparison: None.

CLINICAL DATA: Left elbow pain and swelling following an injury.

EXAM:
LEFT ELBOW - COMPLETE 3+ VIEW

[elbow ap]
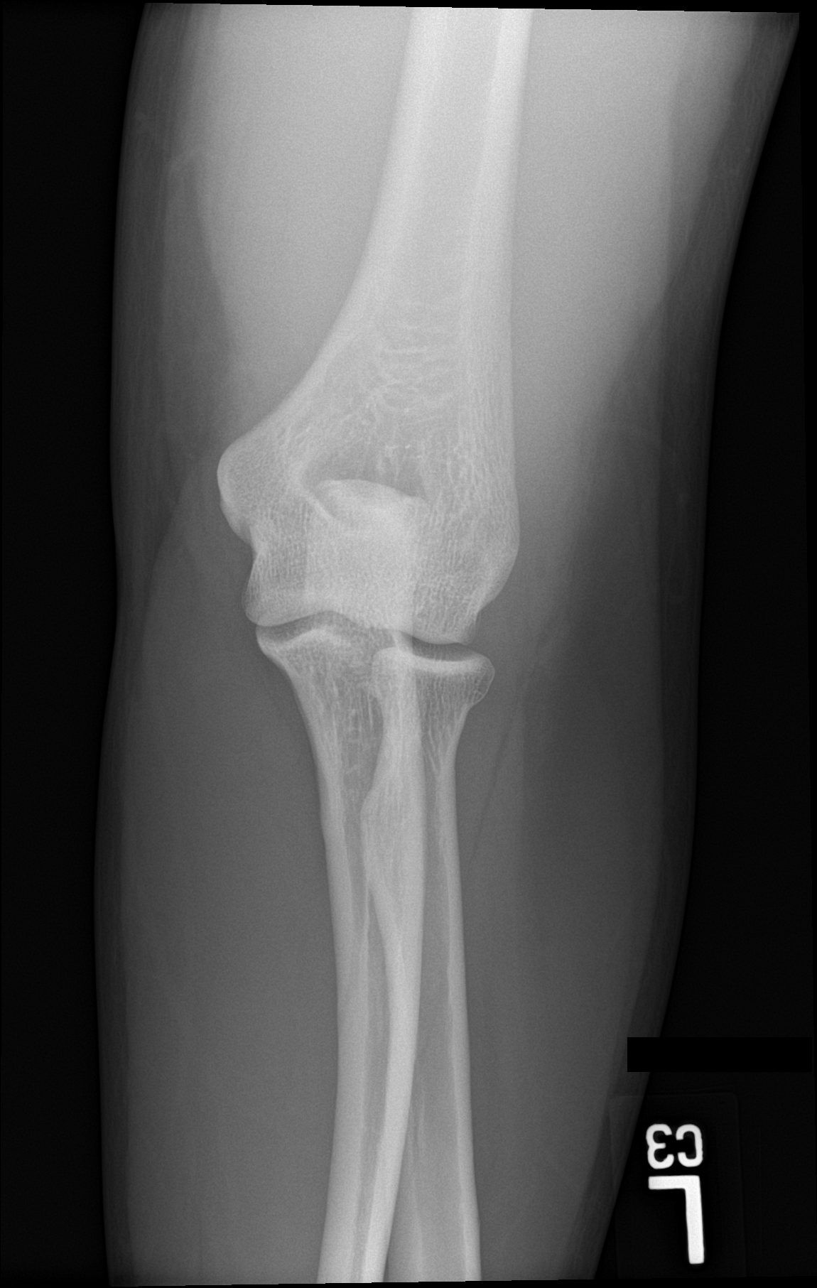

[elbow obl (1 of 2)]
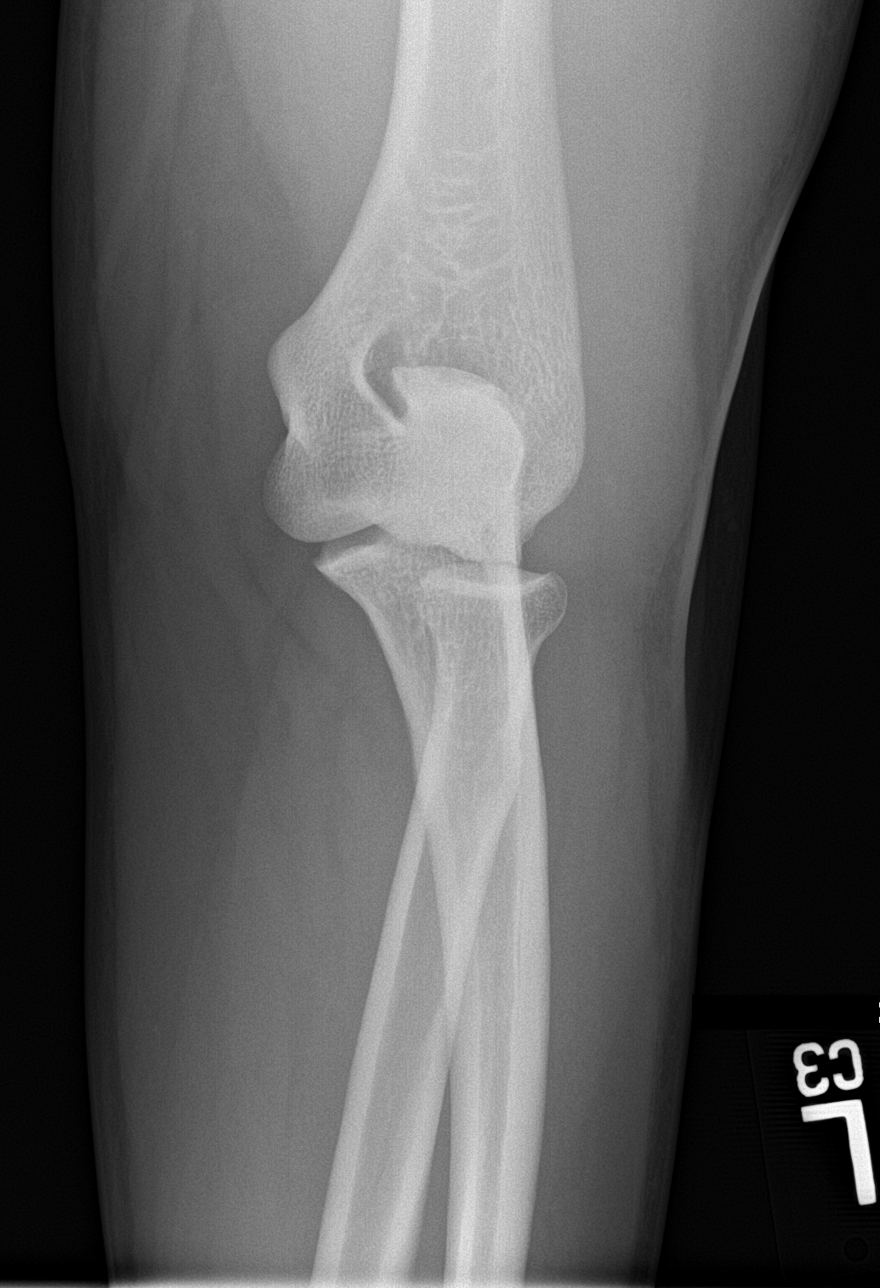

[elbow obl (2 of 2)]
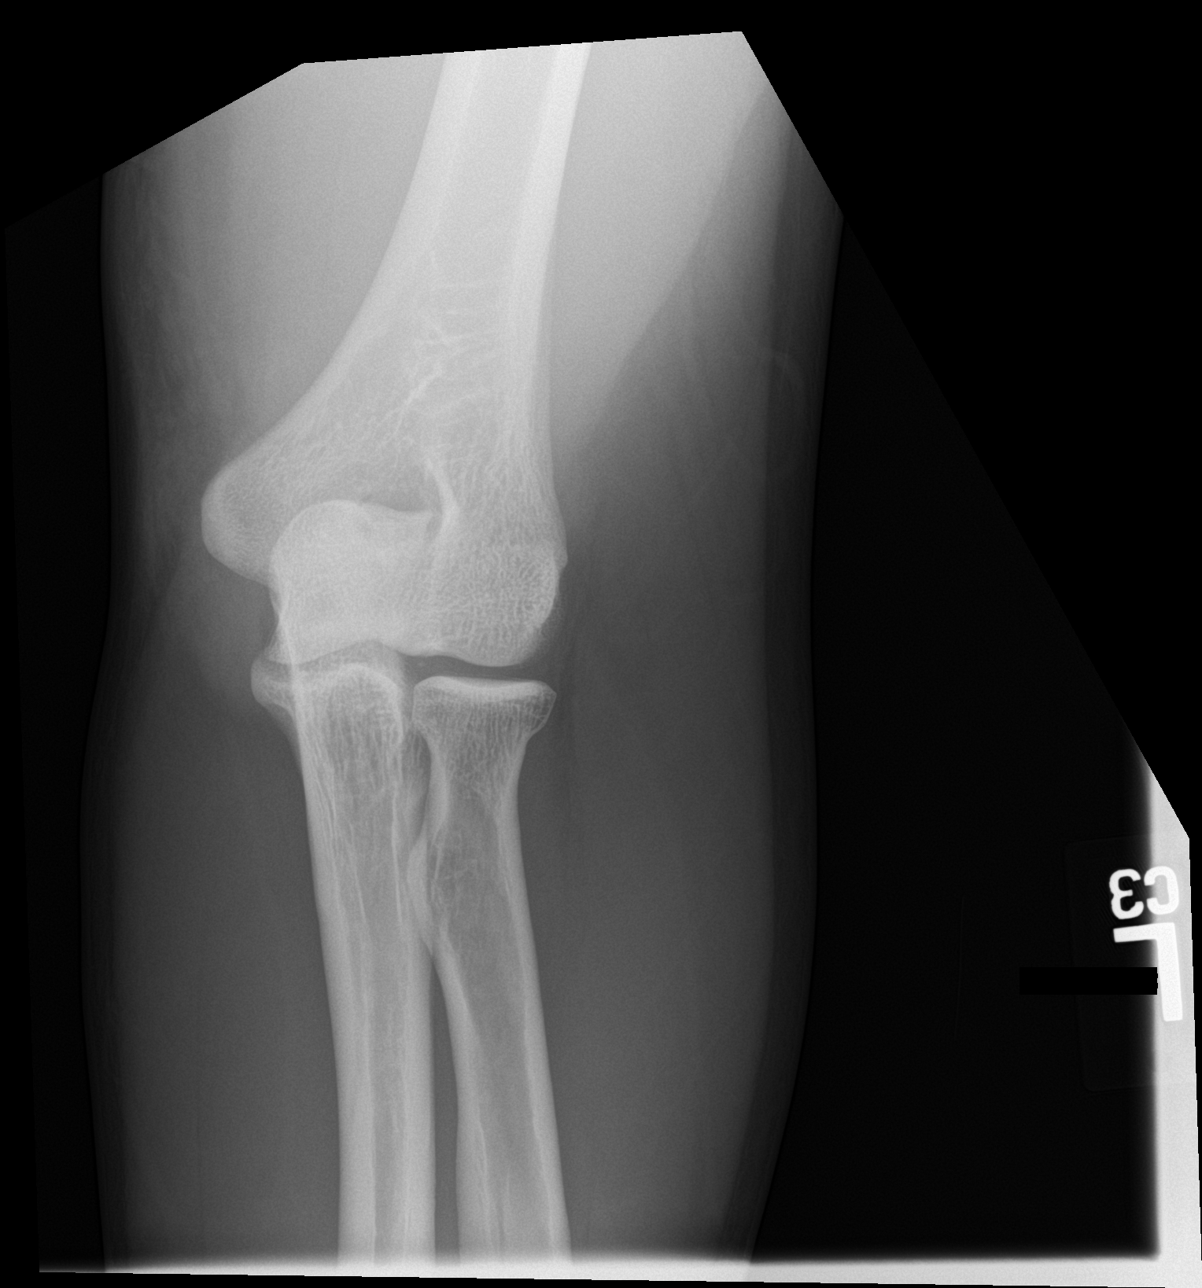

[elbow lat]
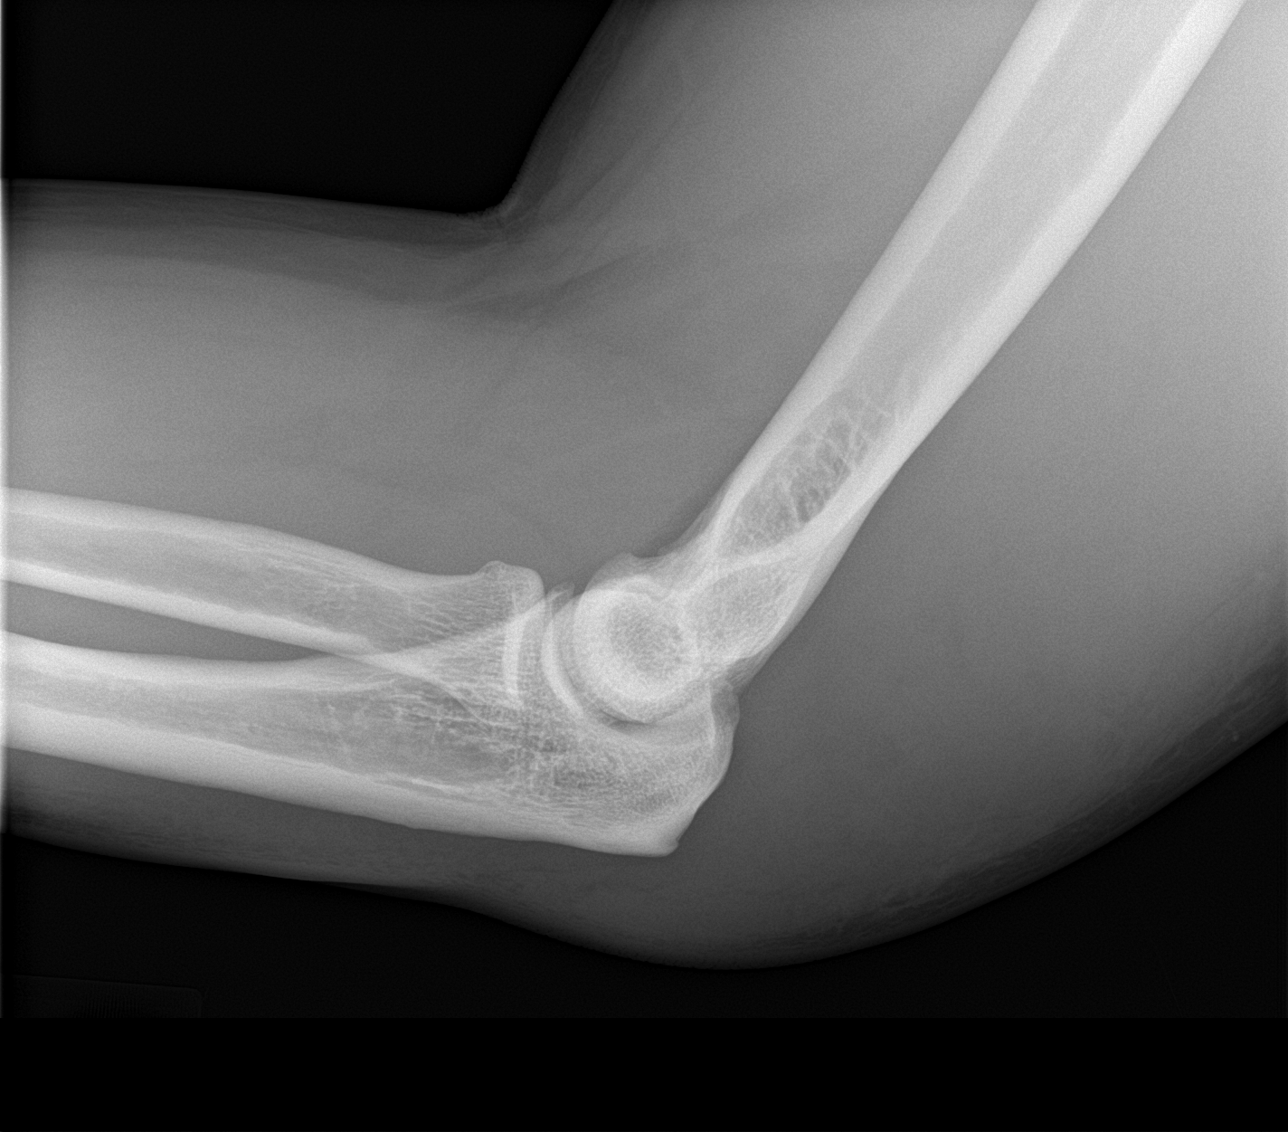

[4 of 4 positions shown; findings below may reference images not displayed]

FINDINGS: Tiny olecranon spur. Posterior soft tissue swelling. No fracture,
dislocation or effusion.
IMPRESSION: No fracture.

## 2020-05-25 ENCOUNTER — Ambulatory Visit: Payer: 59 | Admitting: Psychiatry

## 2020-07-24 ENCOUNTER — Ambulatory Visit (INDEPENDENT_AMBULATORY_CARE_PROVIDER_SITE_OTHER): Payer: 59 | Admitting: Psychiatry

## 2020-07-24 ENCOUNTER — Encounter: Payer: Self-pay | Admitting: Psychiatry

## 2020-07-24 ENCOUNTER — Other Ambulatory Visit: Payer: Self-pay

## 2020-07-24 VITALS — BP 147/96 | HR 66 | Ht 71.0 in | Wt 223.0 lb

## 2020-07-24 DIAGNOSIS — F331 Major depressive disorder, recurrent, moderate: Secondary | ICD-10-CM | POA: Diagnosis not present

## 2020-07-24 DIAGNOSIS — F5105 Insomnia due to other mental disorder: Secondary | ICD-10-CM | POA: Diagnosis not present

## 2020-07-24 MED ORDER — BUPROPION HCL ER (XL) 150 MG PO TB24: ORAL_TABLET | ORAL | 0 refills | Status: DC

## 2020-07-24 MED ORDER — TRAZODONE HCL 50 MG PO TABS: 50.0000 mg | ORAL_TABLET | Freq: Every day | ORAL | 1 refills | Status: DC

## 2020-07-24 NOTE — Progress Notes (Signed)
[facility] MD/PA/NP Initial Note  07/24/2020 11:05 AM Benjamin Montgomery  MRN:  [id number]  Chief Complaint:  Chief Complaint    mood swings; Sleeping Problem; Fatigue      HPI: Referred by a friend Ranae Pila. Sx in spurts.  1 week good and then another bad week without pcpt.  Bad weeks are without energy or motivation, low sex interest, unsure about sadness but wants to isolate and escape.  Wife concerned about his lack of sexual interest and fits of road rage but he's fighting to control it.  She says he seems unhappy and less interest and activity. 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  Does not affect the pattern.   Fireman for 9 years. In good week pretty close to normal.   Insomnia persistent.  I can't shut my brain off on random mostly negative thoughts and guilt feelings daily.  Will be about what happened in the day.. Average 4-5 hours and wants 8 hours.  Initial and terminal insomnia. Tried Benadryl without help. Seemed to occur after hurt tricep last year.  More down after that.  Healed now.   Bothered by wife's messiness but gives him anxiety.  Otherwise not nervous.  May obsess over this.  Alcohol limited to weekends and 5-6 beers.   In the past would have a bad week rarely.  Visit Diagnosis:    ICD-10-CM   1. Major depressive disorder, recurrent episode, moderate (HCC)  F33.1 buPROPion (WELLBUTRIN XL) 150 MG 24 hr tablet  2. Insomnia due to mental condition  F51.05 traZODone (DESYREL) 50 MG tablet    Past Psychiatric History:  On Adderall XR 25 mg from [facility] Slates MD Dx ADD 3rd grade. No other psych meds No other sleep meds  Past Medical History:  Past Medical History:  Diagnosis Date  . GERD (gastroesophageal reflux disease)    occ- no meds    Past Surgical History:  Procedure Laterality Date  . NO PAST SURGERIES    . TRICEPS TENDON REPAIR Left 07/21/2019   Procedure: TRICEPS TENDON REPAIR;  Surgeon: Juanell Fairly, MD;  Location: [facility] ORS;  Service:  Orthopedics;  Laterality: Left;  Arm injury last year DT moving ladder.  Family Psychiatric History: No bipolar, depression, anxiety, ADD known. Only child. F alcoholic and not involved with family but stopped DT health.  Family History:  Family History  Problem Relation Age of Onset  . Hyperlipidemia Mother   . Hypertension Mother   . Cancer Father     Social History:  M 4 years and together 10 years. 1 2yo Chloe, wife pregnant and due Jan 30. Wife is nurse [employer].  Social History   Socioeconomic History  . Marital status: Married    Spouse name: Not on file  . Number of children: Not on file  . Years of education: Not on file  . Highest education level: Not on file  Occupational History  . Not on file  Tobacco Use  . Smoking status: Never Smoker  . Smokeless tobacco: Current User    Types: Chew  Substance and Sexual Activity  . Alcohol use: Yes    Comment: 3 times a week  . Drug use: No  . Sexual activity: Not on file  Other Topics Concern  . Not on file  Social History Narrative  . Not on file   Social Determinants of Health   Financial Resource Strain:   . Difficulty of Paying Living Expenses:   Food Insecurity:   .  Worried About Programme researcher, broadcasting/film/video in the Last Year:   . Barista in the Last Year:   Transportation Needs:   . Freight forwarder (Medical):   Marland Kitchen Lack of Transportation (Non-Medical):   Physical Activity:   . Days of Exercise per Week:   . Minutes of Exercise per Session:   Stress:   . Feeling of Stress :   Social Connections:   . Frequency of Communication with Friends and Family:   . Frequency of Social Gatherings with Friends and Family:   . Attends Religious Services:   . Active Member of Clubs or Organizations:   . Attends Banker Meetings:   Marland Kitchen Marital Status:     Allergies: No Known Allergies  Metabolic Disorder Labs: No results found for: HGBA1C, MPG No results found for: PROLACTIN No results found  for: CHOL, TRIG, HDL, CHOLHDL, VLDL, LDLCALC No results found for: TSH  Therapeutic Level Labs: No results found for: LITHIUM No results found for: VALPROATE No components found for:  CBMZ  Current Medications: Current Outpatient Medications  Medication Sig Dispense Refill  . amphetamine-dextroamphetamine (ADDERALL XR) 25 MG 24 hr capsule Take 25 mg by mouth daily as needed (work).     . diphenhydrAMINE HCl, Sleep, (UNISOM SLEEPGELS) 50 MG CAPS Take 50 mg by mouth at bedtime as needed (sleep).    . Glucosamine-Chondroit-Vit C-Mn (GLUCOSAMINE 1500 COMPLEX PO) Take 2 tablets by mouth daily.     Marland Kitchen buPROPion (WELLBUTRIN XL) 150 MG 24 hr tablet 1 each morning for a week, then 2 each morning 30 tablet 0  . traZODone (DESYREL) 50 MG tablet Take 1-2 tablets (50-100 mg total) by mouth at bedtime. 60 tablet 1   No current facility-administered medications for this visit.    Medication Side Effects: reduced appetite  Orders placed this visit:  No orders of the defined types were placed in this encounter.   Psychiatric Specialty Exam:  Review of Systems  Constitutional: Positive for fatigue. Negative for fever and unexpected weight change.  HENT: Negative for congestion, ear discharge, ear pain, hearing loss and sinus pressure.   Eyes: Negative for visual disturbance.  Respiratory: Negative for chest tightness, shortness of breath and wheezing.   Cardiovascular: Negative for chest pain and palpitations.  Gastrointestinal: Negative for abdominal distention, abdominal pain, diarrhea and nausea.  Endocrine: Negative for polyuria.  Genitourinary: Negative for difficulty urinating and frequency.  Musculoskeletal: Negative for arthralgias, gait problem and neck stiffness.  Skin: Negative for rash.  Neurological: Negative for dizziness, tremors, weakness and headaches.  Psychiatric/Behavioral: Negative for agitation, confusion, self-injury and suicidal ideas. The patient is not hyperactive.      Blood pressure (!) 147/96, pulse 66, height 5\' 11"  (1.803 m), weight (!) 223 lb (101.2 kg).Body mass index is 31.1 kg/m.  General Appearance: Casual  Eye Contact:  Good  Speech:  Clear and Coherent and Normal Rate  Volume:  Normal  Mood:  Blah  Affect:  Appropriate and Constricted  Thought Process:  Coherent, Goal Directed and Descriptions of Associations: Intact  Orientation:  Full (Time, Place, and Person)  Thought Content: Logical   Suicidal Thoughts:  No  Homicidal Thoughts:  No  Memory:  WNL  Judgement:  Good  Insight:  Good  Psychomotor Activity:  Normal  Concentration:  Concentration: Good  Recall:  Good  Fund of Knowledge: Good  Language: Good  Assets:  Communication Skills Desire for Improvement Financial Resources/Insurance Housing Social Support Talents/Skills  ADL's:  Intact  Cognition: WNL  Prognosis:  Fair   Screenings: MDQ neg except as noted  Receiving Psychotherapy: No   Treatment Plan/Recommendations: This was a 1 hour appointment Cycling depression is uncommon pattern without sig hypomanic patterns.  Depression sx are typical.  We discussed his diagnosis of major depression and insomnia but that there could be an underlying bipolar predisposition.  He will bring his wife at the next visit to further investigate this possibility however he denies significant hypomanic or manic symptoms.    His insomnia is persistent even when he has a relatively good week and it is possible that the insomnia could be driving all the symptoms.  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  We discussed sleep hygiene and consistency as being helpful but sometimes that is not possible. Start trazodone 50-100 mg nightly.  Discussed side effects including rare risk of priapism.  Suggested he might try this week or 2 and see if improved sleep resolves the mood symptoms.  However his mood symptoms are significant enough that they are interfering with his quality  of his life and affecting both his job and his marriage.  We discussed antidepressant options and different types of antidepressants including SSRIs as well as others that are less likely to cause sexual side effects.  Given that sexual functioning is a concern we will initiate Wellbutrin 150 mg every morning for 1 week and then 300 mg every morning.  Discussed side effects   Follow-up 8 weeks  Lauraine Rinne, MD

## 2020-08-15 ENCOUNTER — Other Ambulatory Visit: Payer: Self-pay | Admitting: Psychiatry

## 2020-08-15 DIAGNOSIS — F5105 Insomnia due to other mental disorder: Secondary | ICD-10-CM

## 2020-08-24 ENCOUNTER — Other Ambulatory Visit: Payer: Self-pay | Admitting: Psychiatry

## 2020-08-24 ENCOUNTER — Telehealth: Payer: Self-pay | Admitting: Psychiatry

## 2020-08-24 DIAGNOSIS — F331 Major depressive disorder, recurrent, moderate: Secondary | ICD-10-CM

## 2020-08-24 MED ORDER — BUPROPION HCL ER (XL) 300 MG PO TB24: ORAL_TABLET | ORAL | 1 refills | Status: DC

## 2020-08-24 NOTE — Telephone Encounter (Signed)
Pt taking Bupropion 150mg  - did not feel much different on it.  But says he did run out of it two or three weeks ago though. Feeling weird. Can he get another RX sent in. [facility] [address] , [place]

## 2020-08-24 NOTE — Telephone Encounter (Signed)
He should have gotten up to 300 mg Wellbutrin.  I'll send in RX for that dosage.  Lower doses are unlikely to help.  If there's a problem let me know.  I sent in RX.  Takes 2-4 weeks to help.

## 2020-08-24 NOTE — Telephone Encounter (Signed)
Pt. Made aware and will pick up from his pharmacy.

## 2020-08-31 ENCOUNTER — Other Ambulatory Visit: Payer: Self-pay | Admitting: Psychiatry

## 2020-08-31 DIAGNOSIS — F331 Major depressive disorder, recurrent, moderate: Secondary | ICD-10-CM

## 2020-09-20 ENCOUNTER — Other Ambulatory Visit: Payer: Self-pay | Admitting: Psychiatry

## 2020-09-20 DIAGNOSIS — F331 Major depressive disorder, recurrent, moderate: Secondary | ICD-10-CM

## 2020-09-20 NOTE — Telephone Encounter (Signed)
review 

## 2020-09-25 ENCOUNTER — Encounter: Payer: Self-pay | Admitting: Psychiatry

## 2020-09-25 ENCOUNTER — Other Ambulatory Visit: Payer: Self-pay

## 2020-09-25 ENCOUNTER — Ambulatory Visit (INDEPENDENT_AMBULATORY_CARE_PROVIDER_SITE_OTHER): Payer: 59 | Admitting: Psychiatry

## 2020-09-25 DIAGNOSIS — F5105 Insomnia due to other mental disorder: Secondary | ICD-10-CM | POA: Diagnosis not present

## 2020-09-25 DIAGNOSIS — F331 Major depressive disorder, recurrent, moderate: Secondary | ICD-10-CM | POA: Diagnosis not present

## 2020-09-25 MED ORDER — BUPROPION HCL ER (XL) 150 MG PO TB24: 150.0000 mg | ORAL_TABLET | Freq: Every day | ORAL | 0 refills | Status: AC

## 2020-09-25 MED ORDER — TRAZODONE HCL 50 MG PO TABS: 50.0000 mg | ORAL_TABLET | Freq: Every day | ORAL | 0 refills | Status: AC

## 2020-09-25 NOTE — Progress Notes (Signed)
Benjamin Montgomery [id number] 02-Apr-1986 34 y.o.  Subjective:   Patient ID:  Benjamin Montgomery is a 34 y.o. (DOB 02-18-1986) male.  Chief Complaint:  Chief Complaint  Patient presents with  . Follow-up  . Depression  . Sleeping Problem    HPI [name] presents to the office today for follow-up of first appt 07/24/20 for major depression and insomnia.  09/25/20 appt with following noted: seen with wife Abbie. Started trazodone for sleep and Wellbutrin for depression at first appt. Didn't like Wellbutrin bc felt anxious and felt it slowed down his thoughts, but now he feels better without the SE.  Feels his thoughts are still not as quick. Sleep fine with trazodone with some hangover. 6-7 hours of sleep at home but not much at work. Takes Adderall just when he works. Good work function. Has felt a little better in the last week with better motivation.  Denies sadness.  No depressive sx now.  Wife doesn't notice any differences from before.  She thought he had work related problems and didn't necessarily think he was depressed.  She doesn't remember seeing him in depressive episodes.  She would see him distant in use of alcohold and video games. She denies history of hypomanic episodes.  She's concerned about his general lack of sexual interest and not waxing/waning pattern. She's concerned about the use of Adderall.   Review of Systems:  Review of Systems  Neurological: Negative for tremors and weakness.    Medications: I have reviewed the patient's current medications.  Current Outpatient Medications  Medication Sig Dispense Refill  . amphetamine-dextroamphetamine (ADDERALL XR) 25 MG 24 hr capsule Take 25 mg by mouth daily as needed (work).     Marland Kitchen atorvastatin (LIPITOR) 20 MG tablet Take 20 mg by mouth daily.    Marland Kitchen buPROPion (WELLBUTRIN XL) 150 MG 24 hr tablet Take 1 tablet (150 mg total) by mouth daily. 90 tablet 0  . diphenhydrAMINE HCl, Sleep, (UNISOM SLEEPGELS) 50 MG CAPS Take 50 mg by mouth  at bedtime as needed (sleep).    . Glucosamine-Chondroit-Vit C-Mn (GLUCOSAMINE 1500 COMPLEX PO) Take 2 tablets by mouth daily.     . traZODone (DESYREL) 50 MG tablet Take 1-2 tablets (50-100 mg total) by mouth at bedtime. 180 tablet 0   No current facility-administered medications for this visit.    Medication Side Effects: Other: intermittent tinnitus, dry mouth  Allergies: No Known Allergies  Past Medical History:  Diagnosis Date  . GERD (gastroesophageal reflux disease)    occ- no meds    Family History  Problem Relation Age of Onset  . Hyperlipidemia Mother   . Hypertension Mother   . Cancer Father     Social History   Socioeconomic History  . Marital status: Married    Spouse name: Not on file  . Number of children: Not on file  . Years of education: Not on file  . Highest education level: Not on file  Occupational History  . Not on file  Tobacco Use  . Smoking status: Never Smoker  . Smokeless tobacco: Current User    Types: Chew  Substance and Sexual Activity  . Alcohol use: Yes    Comment: 3 times a week  . Drug use: No  . Sexual activity: Not on file  Other Topics Concern  . Not on file  Social History Narrative  . Not on file   Social Determinants of Health   Financial Resource Strain:   . Difficulty of Paying Living Expenses:  Not on file  Food Insecurity:   . Worried About Programme researcher, broadcasting/film/video in the Last Year: Not on file  . Ran Out of Food in the Last Year: Not on file  Transportation Needs:   . Lack of Transportation (Medical): Not on file  . Lack of Transportation (Non-Medical): Not on file  Physical Activity:   . Days of Exercise per Week: Not on file  . Minutes of Exercise per Session: Not on file  Stress:   . Feeling of Stress : Not on file  Social Connections:   . Frequency of Communication with Friends and Family: Not on file  . Frequency of Social Gatherings with Friends and Family: Not on file  . Attends Religious Services: Not on  file  . Active Member of Clubs or Organizations: Not on file  . Attends Banker Meetings: Not on file  . Marital Status: Not on file  Intimate Partner Violence:   . Fear of Current or Ex-Partner: Not on file  . Emotionally Abused: Not on file  . Physically Abused: Not on file  . Sexually Abused: Not on file    Past Medical History, Surgical history, Social history, and Family history were reviewed and updated as appropriate.   Please see review of systems for further details on the patient's review from today.   Objective:   Physical Exam:  There were no vitals taken for this visit.  Physical Exam Constitutional:      General: He is not in acute distress. Musculoskeletal:        General: No deformity.  Neurological:     Mental Status: He is alert and oriented to person, place, and time.     Coordination: Coordination normal.  Psychiatric:        Attention and Perception: Attention and perception normal. He does not perceive auditory or visual hallucinations.        Mood and Affect: Mood normal. Mood is not anxious or depressed. Affect is not labile, blunt, angry or inappropriate.        Speech: Speech normal.        Behavior: Behavior normal.        Thought Content: Thought content normal. Thought content is not paranoid or delusional. Thought content does not include homicidal or suicidal ideation. Thought content does not include homicidal or suicidal plan.        Cognition and Memory: Cognition and memory normal.        Judgment: Judgment normal.     Comments: Insight intact     Lab Review:     Component Value Date/Time   NA 139 07/21/2019 1000   K 4.2 07/21/2019 1000   CL 106 07/21/2019 1000   CO2 26 07/21/2019 1000   GLUCOSE 100 (H) 07/21/2019 1000   BUN 16 07/21/2019 1000   CREATININE 0.99 07/21/2019 1000   CALCIUM 9.4 07/21/2019 1000   PROT 6.8 03/14/2019 1403   ALBUMIN 4.5 03/14/2019 1403   AST 18 03/14/2019 1403   ALT 23 03/14/2019 1403    ALKPHOS 49 03/14/2019 1403   BILITOT 0.6 03/14/2019 1403   GFRNONAA >60 07/21/2019 1000   GFRAA >60 07/21/2019 1000       Component Value Date/Time   WBC 6.5 07/21/2019 1000   RBC 4.63 07/21/2019 1000   HGB 14.0 07/21/2019 1000   HCT 40.8 07/21/2019 1000   PLT 230 07/21/2019 1000   MCV 88.1 07/21/2019 1000   MCH 30.2 07/21/2019 1000  MCHC 34.3 07/21/2019 1000   RDW 12.6 07/21/2019 1000   LYMPHSABS 2.0 07/21/2019 1000   MONOABS 0.5 07/21/2019 1000   EOSABS 0.3 07/21/2019 1000   BASOSABS 0.0 07/21/2019 1000    No results found for: POCLITH, LITHIUM   No results found for: PHENYTOIN, PHENOBARB, VALPROATE, CBMZ   .res Assessment: Plan:    Taurus was seen today for follow-up, depression and sleeping problem.  Diagnoses and all orders for this visit:  Major depressive disorder, recurrent episode, moderate (HCC) -     buPROPion (WELLBUTRIN XL) 150 MG 24 hr tablet; Take 1 tablet (150 mg total) by mouth daily.  Insomnia due to mental condition -     traZODone (DESYREL) 50 MG tablet; Take 1-2 tablets (50-100 mg total) by mouth at bedtime.   Greater than 50% of face to face time with patient was spent on counseling and coordination of care. We discussed several issues including RO bipolar.  This diagnosis ruled out.  Doing well as far as depression.  Sleep better with trazodone.  Feels like some SE slowed thinking with Wellbutrin.  Wife does not give any history of him having mania/hypomania so no evidence for bipolar despite description of cycling depression.  Reduce Wellbutrin to 150 mg daily. Disc SE.  Continue trazodone 100 mg nightly except when he works.  FU 2 mos and decide about Wellbutrin.  Improving sleep alone may be sufficient alone to help mood.  Meredith Staggers, MD, DFAPA    Please see After Visit Summary for patient specific instructions.  Future Appointments  Date Time Provider Department Center  11/27/2020  2:30 PM Cottle, Steva Ready., MD [facility] None     No orders of the defined types were placed in this encounter.   -------------------------------

## 2020-11-27 ENCOUNTER — Encounter: Payer: Self-pay | Admitting: Psychiatry

## 2020-11-27 ENCOUNTER — Ambulatory Visit (INDEPENDENT_AMBULATORY_CARE_PROVIDER_SITE_OTHER): Payer: 59 | Admitting: Psychiatry

## 2020-11-27 ENCOUNTER — Other Ambulatory Visit: Payer: Self-pay

## 2020-11-27 DIAGNOSIS — F325 Major depressive disorder, single episode, in full remission: Secondary | ICD-10-CM | POA: Diagnosis not present

## 2020-11-27 NOTE — Progress Notes (Signed)
Benjamin Montgomery [id number] 1986/07/06 34 y.o.  Subjective:   Patient ID:  Benjamin Montgomery is a 34 y.o. (DOB 09-May-1986) male.  Chief Complaint:  Chief Complaint  Patient presents with  . Follow-up  . Depression  . Sleeping Problem    HPI Benjamin Montgomery presents to the office today for follow-up of first appt 07/24/20 for major depression and insomnia and history of ADD.  09/25/20 appt with following noted: seen with wife Abbie. Started trazodone for sleep and Wellbutrin for depression at first appt. Didn't like Wellbutrin bc felt anxious and felt it slowed down his thoughts, but now Benjamin Montgomery feels better without the SE.  Feels his thoughts are still not as quick. Sleep fine with trazodone with some hangover. 6-7 hours of sleep at home but not much at work. Takes Adderall just when Benjamin Montgomery works. Good work function. Has felt a little better in the last week with better motivation.  Denies sadness.  No depressive sx now. Wife doesn't notice any differences from before.  She thought Benjamin Montgomery had work related problems and didn't necessarily think Benjamin Montgomery was depressed.  She doesn't remember seeing him in depressive episodes.  She would see him distant in use of alcohold and video games. She denies history of hypomanic episodes.  She's concerned about his general lack of sexual interest and not waxing/waning pattern. She's concerned about the use of Adderall. Plan:Reduce Wellbutrin to 150 mg daily. Disc SE. Continue trazodone 100 mg nightly except when Benjamin Montgomery works.  11/27/2020 appointment with following noted: Stopped Wellbutrin and feels 100 times better. No trazodone for 3 weeks.  Had hangover problems.  Taking diphenhydramine and it's  OK.  Sleeping well.   More running and exercising and that helps with mood and sleep.  Helped tremendously.   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 Weaning off Adderall also. Patient reports stable mood and denies depressed or irritable moods.   Patient denies any recent difficulty with anxiety.  Patient denies difficulty with sleep initiation or maintenance. Denies appetite disturbance.  Patient reports that energy and motivation have been good. More enjoyment and interest working on car.   Patient denies any difficulty with concentration.  Patient denies any suicidal ideation.  Past Psychiatric Medication Trials: Trazodone SE Wellbutrin 300  Review of Systems:  Review of Systems  Neurological: Negative for tremors and weakness.  Psychiatric/Behavioral: Negative for dysphoric mood. The patient is not nervous/anxious.     Medications: I have reviewed the patient's current medications.  Current Outpatient Medications  Medication Sig Dispense Refill  . amphetamine-dextroamphetamine (ADDERALL XR) 25 MG 24 hr capsule Take 25 mg by mouth daily as needed (work).     Marland Kitchen atorvastatin (LIPITOR) 20 MG tablet Take 20 mg by mouth daily.    . diphenhydrAMINE HCl, Sleep, (UNISOM SLEEPGELS) 50 MG CAPS Take 50 mg by mouth at bedtime as needed (sleep).    . Glucosamine-Chondroit-Vit C-Mn (GLUCOSAMINE 1500 COMPLEX PO) Take 2 tablets by mouth daily.     Marland Kitchen buPROPion (WELLBUTRIN XL) 150 MG 24 hr tablet Take 1 tablet (150 mg total) by mouth daily. (Patient not taking: Reported on 11/27/2020) 90 tablet 0  . traZODone (DESYREL) 50 MG tablet Take 1-2 tablets (50-100 mg total) by mouth at bedtime. (Patient not taking: Reported on 11/27/2020) 180 tablet 0   No current facility-administered medications for this visit.    Medication Side Effects: none  Allergies: No Known Allergies  Past Medical History:  Diagnosis Date  .  GERD (gastroesophageal reflux disease)    occ- no meds    Family History  Problem Relation Age of Onset  . Hyperlipidemia Mother   . Hypertension Mother   . Cancer Father     Social History   Socioeconomic History  . Marital status: Married    Spouse name: Not on file  . Number of children: Not on file  . Years of  education: Not on file  . Highest education level: Not on file  Occupational History  . Not on file  Tobacco Use  . Smoking status: Never Smoker  . Smokeless tobacco: Current User    Types: Chew  Substance and Sexual Activity  . Alcohol use: Yes    Comment: 3 times a week  . Drug use: No  . Sexual activity: Not on file  Other Topics Concern  . Not on file  Social History Narrative  . Not on file   Social Determinants of Health   Financial Resource Strain:   . Difficulty of Paying Living Expenses: Not on file  Food Insecurity:   . Worried About Programme researcher, broadcasting/film/video in the Last Year: Not on file  . Ran Out of Food in the Last Year: Not on file  Transportation Needs:   . Lack of Transportation (Medical): Not on file  . Lack of Transportation (Non-Medical): Not on file  Physical Activity:   . Days of Exercise per Week: Not on file  . Minutes of Exercise per Session: Not on file  Stress:   . Feeling of Stress : Not on file  Social Connections:   . Frequency of Communication with Friends and Family: Not on file  . Frequency of Social Gatherings with Friends and Family: Not on file  . Attends Religious Services: Not on file  . Active Member of Clubs or Organizations: Not on file  . Attends Banker Meetings: Not on file  . Marital Status: Not on file  Intimate Partner Violence:   . Fear of Current or Ex-Partner: Not on file  . Emotionally Abused: Not on file  . Physically Abused: Not on file  . Sexually Abused: Not on file    Past Medical History, Surgical history, Social history, and Family history were reviewed and updated as appropriate.   Please see review of systems for further details on the patient's review from today.   Objective:   Physical Exam:  There were no vitals taken for this visit.  Physical Exam Constitutional:      General: Benjamin Montgomery is not in acute distress. Musculoskeletal:        General: No deformity.  Neurological:     Mental Status:  Benjamin Montgomery is alert and oriented to person, place, and time.     Coordination: Coordination normal.  Psychiatric:        Attention and Perception: Attention and perception normal. Benjamin Montgomery does not perceive auditory or visual hallucinations.        Mood and Affect: Mood normal. Mood is not anxious or depressed. Affect is not labile, blunt, angry or inappropriate.        Speech: Speech normal.        Behavior: Behavior normal. Behavior is not slowed.        Thought Content: Thought content normal. Thought content is not paranoid or delusional. Thought content does not include homicidal or suicidal ideation. Thought content does not include homicidal or suicidal plan.        Cognition and Memory: Cognition and memory  normal.        Judgment: Judgment normal.     Comments: Insight intact     Lab Review:     Component Value Date/Time   NA 139 07/21/2019 1000   K 4.2 07/21/2019 1000   CL 106 07/21/2019 1000   CO2 26 07/21/2019 1000   GLUCOSE 100 (H) 07/21/2019 1000   BUN 16 07/21/2019 1000   CREATININE 0.99 07/21/2019 1000   CALCIUM 9.4 07/21/2019 1000   PROT 6.8 03/14/2019 1403   ALBUMIN 4.5 03/14/2019 1403   AST 18 03/14/2019 1403   ALT 23 03/14/2019 1403   ALKPHOS 49 03/14/2019 1403   BILITOT 0.6 03/14/2019 1403   GFRNONAA >60 07/21/2019 1000   GFRAA >60 07/21/2019 1000       Component Value Date/Time   WBC 6.5 07/21/2019 1000   RBC 4.63 07/21/2019 1000   HGB 14.0 07/21/2019 1000   HCT 40.8 07/21/2019 1000   PLT 230 07/21/2019 1000   MCV 88.1 07/21/2019 1000   MCH 30.2 07/21/2019 1000   MCHC 34.3 07/21/2019 1000   RDW 12.6 07/21/2019 1000   LYMPHSABS 2.0 07/21/2019 1000   MONOABS 0.5 07/21/2019 1000   EOSABS 0.3 07/21/2019 1000   BASOSABS 0.0 07/21/2019 1000    No results found for: POCLITH, LITHIUM   No results found for: PHENYTOIN, PHENOBARB, VALPROATE, CBMZ   .res Assessment: Plan:    Benjamin Montgomery was seen today for follow-up, depression and sleeping problem.  Diagnoses and  all orders for this visit:  Major depression in complete remission (HCC)   Insomnia resolved  Done well off psych meds.  Disc goal of pilot's license.  Needs a letter for the [employer] and Benjamin Montgomery will get [place] that information when it's received.  No psychiatric indication to prohibit. Did well with academics when interested even without stimulant.  FU prn  Meredith Staggers, MD, DFAPA    Please see After Visit Summary for patient specific instructions.  No future appointments.  No orders of the defined types were placed in this encounter.   -------------------------------

## 2020-12-18 ENCOUNTER — Telehealth: Payer: Self-pay | Admitting: Psychiatry

## 2020-12-18 NOTE — Telephone Encounter (Signed)
Error

## 2021-12-16 DIAGNOSIS — M778 Other enthesopathies, not elsewhere classified: Secondary | ICD-10-CM | POA: Insufficient documentation

## 2022-09-07 DIAGNOSIS — M7022 Olecranon bursitis, left elbow: Secondary | ICD-10-CM | POA: Diagnosis not present

## 2022-09-27 DIAGNOSIS — M62830 Muscle spasm of back: Secondary | ICD-10-CM | POA: Diagnosis not present

## 2022-10-17 ENCOUNTER — Emergency Department
Admission: EM | Admit: 2022-10-17 | Discharge: 2022-10-17 | Disposition: A | Discharge status: Home / Self Care | Payer: BC Managed Care – PPO | Attending: Emergency Medicine | Admitting: Emergency Medicine

## 2022-10-17 ENCOUNTER — Other Ambulatory Visit: Payer: Self-pay

## 2022-10-17 DIAGNOSIS — K2900 Acute gastritis without bleeding: Secondary | ICD-10-CM | POA: Diagnosis not present

## 2022-10-17 DIAGNOSIS — Z1152 Encounter for screening for COVID-19: Secondary | ICD-10-CM | POA: Diagnosis not present

## 2022-10-17 DIAGNOSIS — R519 Headache, unspecified: Secondary | ICD-10-CM | POA: Diagnosis not present

## 2022-10-17 DIAGNOSIS — R1010 Upper abdominal pain, unspecified: Secondary | ICD-10-CM | POA: Diagnosis not present

## 2022-10-17 LAB — COMPREHENSIVE METABOLIC PANEL
ALT: 21 U/L (ref 0–44)
AST: 28 U/L (ref 15–41)
Albumin: 3.9 g/dL (ref 3.5–5.0)
Alkaline Phosphatase: 42 U/L (ref 38–126)
Anion gap: 7 (ref 5–15)
BUN: 10 mg/dL (ref 6–20)
CO2: 26 mmol/L (ref 22–32)
Calcium: 8.8 mg/dL — ABNORMAL LOW (ref 8.9–10.3)
Chloride: 104 mmol/L (ref 98–111)
Creatinine, Ser: 1.08 mg/dL (ref 0.61–1.24)
GFR, Estimated: >60 mL/min (ref >60)
Glucose, Bld: 110 mg/dL — ABNORMAL HIGH (ref 70–99)
Potassium: 3.7 mmol/L (ref 3.5–5.1)
Sodium: 137 mmol/L (ref 135–145)
Total Bilirubin: 1.1 mg/dL (ref 0.3–1.2)
Total Protein: 6.9 g/dL (ref 6.5–8.1)

## 2022-10-17 LAB — CBC
HCT: 44.5 % (ref 39.0–52.0)
Hemoglobin: 14.6 g/dL (ref 13.0–17.0)
MCH: 29.9 pg (ref 26.0–34.0)
MCHC: 32.8 g/dL (ref 30.0–36.0)
MCV: 91 fL (ref 80.0–100.0)
Platelets: 245 10*3/uL (ref 150–400)
RBC: 4.89 MIL/uL (ref 4.22–5.81)
RDW: 13.1 % (ref 11.5–15.5)
WBC: 8.6 10*3/uL (ref 4.0–10.5)
nRBC: 0 % (ref 0.0–0.2)

## 2022-10-17 LAB — RESP PANEL BY RT-PCR (FLU A&B, COVID) ARPGX2
Influenza A by PCR: NEGATIVE
Influenza B by PCR: NEGATIVE
SARS Coronavirus 2 by RT PCR: NEGATIVE

## 2022-10-17 LAB — LIPASE, BLOOD: Lipase: 31 U/L (ref 11–51)

## 2022-10-17 MED ORDER — ONDANSETRON HCL 4 MG/2ML IJ SOLN
4.0000 mg | Freq: Once | INTRAMUSCULAR | Status: AC
  Administered 2022-10-17: 4 mg via INTRAVENOUS
  Filled 2022-10-17: qty 2

## 2022-10-17 MED ORDER — FAMOTIDINE IN NACL 20-0.9 MG/50ML-% IV SOLN
20.0000 mg | Freq: Once | INTRAVENOUS | Status: AC
  Administered 2022-10-17: 20 mg via INTRAVENOUS
  Filled 2022-10-17: qty 50

## 2022-10-17 MED ORDER — ONDANSETRON 4 MG PO TBDP: 4.0000 mg | ORAL_TABLET | Freq: Three times a day (TID) | ORAL | 0 refills | Status: AC | PRN

## 2022-10-17 MED ORDER — OMEPRAZOLE MAGNESIUM 20 MG PO TBEC: 20.0000 mg | DELAYED_RELEASE_TABLET | Freq: Every day | ORAL | 11 refills | Status: AC

## 2022-10-17 MED ORDER — KETOROLAC TROMETHAMINE 15 MG/ML IJ SOLN
15.0000 mg | Freq: Once | INTRAMUSCULAR | Status: AC
  Administered 2022-10-17: 15 mg via INTRAVENOUS
  Filled 2022-10-17: qty 1

## 2022-10-17 NOTE — ED Provider Notes (Signed)
[facility] Provider Note    Event Date/Time   First MD Initiated Contact with Patient 10/17/22 2202     (approximate)   History   Abdominal Pain   HPI  Benjamin Montgomery is a 36 y.o. male who presents to the emergency department with upper abdominal pain and concern for GI bleed.  Endorses 1 week of upper abdominal pain with nausea and vomiting.  States that he was having diarrhea over the past couple of days but has not had a bowel movement today.  Thought that his blood appeared dark black.  Complaining about headache and not feeling well.  Daily NSAID use.  No prior history of a GI bleed.  No history of an upper endoscopy.  Does endorse daily alcohol use.  Denies any marijuana use.  No vaping but does chew tobacco.      Physical Exam   Triage Vital Signs: ED Triage Vitals  Enc Vitals Group     BP 10/17/22 2141 (!) 152/107     Pulse Rate 10/17/22 2141 77     Resp 10/17/22 2141 20     Temp 10/17/22 2141 98.2 F (36.8 C)     Temp Source 10/17/22 2141 Oral     SpO2 10/17/22 2141 98 %     Weight --      Height --      Head Circumference --      Peak Flow --      Pain Score 10/17/22 2142 9     Pain Loc --      Pain Edu? --      Excl. in [place]? --     Most recent vital signs: Vitals:   10/17/22 2141  BP: (!) 152/107  Pulse: 77  Resp: 20  Temp: 98.2 F (36.8 C)  SpO2: 98%    Physical Exam Constitutional:      Appearance: He is well-developed.  HENT:     Head: Atraumatic.  Eyes:     Conjunctiva/sclera: Conjunctivae normal.  Cardiovascular:     Rate and Rhythm: Regular rhythm.  Pulmonary:     Effort: No respiratory distress.  Abdominal:     Tenderness: There is abdominal tenderness in the epigastric area.  Genitourinary:    Comments: DRE with no gross blood or melena Musculoskeletal:     Cervical back: Normal range of motion.  Skin:    General: Skin is warm.  Neurological:     Mental Status: He is alert. Mental status is at baseline.           IMPRESSION / MDM / ASSESSMENT AND PLAN / ED COURSE  I reviewed the triage vital signs and the nursing notes.  Differential diagnosis including gastritis/peptic ulcer disease, pancreatitis, symptomatic cholelithiasis.  Patient without any lower abdominal tenderness to palpation.  Clinical picture is not consistent with acute appendicitis.  No urinary symptoms, low suspicion for pyelonephritis.  Do not feel that CT scan of the abdomen and pelvis is necessary at this time.  Black stools most likely secondary to the Pepto-Bismol that he has been eating.  No gross blood or melena on my exam.  Low suspicion for atypical ACS do not feel that troponin or ACS work-up is necessary.     EKG    RADIOLOGY   ED Results / Procedures / Treatments   Labs (all labs ordered are listed, but only abnormal results are displayed) Labs interpreted as -   Hemoglobin stable with no anemia.  Creatinine at  baseline.  No significant electrolyte abnormalities.  Lipase within normal limits.  Labs Reviewed  COMPREHENSIVE METABOLIC PANEL - Abnormal; Notable for the following components:      Result Value   Glucose, Bld 110 (*)    Calcium 8.8 (*)    All other components within normal limits  RESP PANEL BY RT-PCR (FLU A&B, COVID) ARPGX2  CBC  LIPASE, BLOOD  POC OCCULT BLOOD, ED  TYPE AND SCREEN  TYPE AND SCREEN     Patient given IV Pepcid and IV ondansetron.  Patient tolerating p.o.  Most likely with a gastritis.  Discussed no NSAIDs and only taking Tylenol for pain control.  Discussed no alcohol in a diet for gastritis.  We will start the patient on a PPI.  Discussed close follow-up with primary care physician and given return precautions for any worsening symptoms.  PROCEDURES:  Critical Care performed: No  Procedures  Patient's presentation is most consistent with acute presentation with potential threat to life or bodily function.   MEDICATIONS ORDERED IN ED: Medications   ketorolac (TORADOL) 15 MG/ML injection 15 mg (15 mg Intravenous Given 10/17/22 2244)  ondansetron (ZOFRAN) injection 4 mg (4 mg Intravenous Given 10/17/22 2244)  famotidine (PEPCID) IVPB 20 mg premix (0 mg Intravenous Stopped 10/17/22 2314)    FINAL CLINICAL IMPRESSION(S) / ED DIAGNOSES   Final diagnoses:  Acute gastritis without hemorrhage, unspecified gastritis type     Rx / DC Orders   ED Discharge Orders          Ordered    ondansetron (ZOFRAN-ODT) 4 MG disintegrating tablet  Every 8 hours PRN        10/17/22 2328    omeprazole (PRILOSEC OTC) 20 MG tablet  Daily        10/17/22 2328             Note:  This document was prepared using Dragon voice recognition software and may include unintentional dictation errors.   Corena Herter, MD 10/17/22 2344

## 2022-10-17 NOTE — ED Triage Notes (Addendum)
Pt with abdominal pain since Monday.  Now has black, tarry stools that began Wednesday and is nauseated and feeling weak when moving around.  Is in the [employer] currently and has been taking "a lot of ibuprofen and acetaminophen"

## 2022-10-18 ENCOUNTER — Ambulatory Visit: Payer: 59

## 2022-11-10 DIAGNOSIS — K219 Gastro-esophageal reflux disease without esophagitis: Secondary | ICD-10-CM | POA: Diagnosis not present

## 2022-11-10 DIAGNOSIS — I10 Essential (primary) hypertension: Secondary | ICD-10-CM | POA: Diagnosis not present

## 2022-11-10 DIAGNOSIS — G5622 Lesion of ulnar nerve, left upper limb: Secondary | ICD-10-CM | POA: Diagnosis not present

## 2022-11-10 DIAGNOSIS — M25522 Pain in left elbow: Secondary | ICD-10-CM | POA: Diagnosis not present

## 2022-11-27 ENCOUNTER — Ambulatory Visit: Payer: BC Managed Care – PPO

## 2022-11-27 ENCOUNTER — Ambulatory Visit
Admission: EM | Admit: 2022-11-27 | Discharge: 2022-11-27 | Disposition: A | Discharge status: Home / Self Care | Payer: BC Managed Care – PPO | Attending: Urgent Care | Admitting: Urgent Care

## 2022-11-27 DIAGNOSIS — Z1152 Encounter for screening for COVID-19: Secondary | ICD-10-CM | POA: Insufficient documentation

## 2022-11-27 DIAGNOSIS — M791 Myalgia, unspecified site: Secondary | ICD-10-CM | POA: Insufficient documentation

## 2022-11-27 DIAGNOSIS — R058 Other specified cough: Secondary | ICD-10-CM | POA: Insufficient documentation

## 2022-11-27 DIAGNOSIS — R6883 Chills (without fever): Secondary | ICD-10-CM | POA: Diagnosis not present

## 2022-11-27 DIAGNOSIS — R6889 Other general symptoms and signs: Secondary | ICD-10-CM | POA: Diagnosis not present

## 2022-11-27 LAB — RESP PANEL BY RT-PCR (FLU A&B, COVID) ARPGX2
Influenza A by PCR: NEGATIVE
Influenza B by PCR: NEGATIVE
SARS Coronavirus 2 by RT PCR: NEGATIVE

## 2022-11-27 MED ORDER — HYDROCOD POLI-CHLORPHE POLI ER 10-8 MG/5ML PO SUER: 5.0000 mL | Freq: Two times a day (BID) | ORAL | 0 refills | Status: AC | PRN

## 2022-11-27 NOTE — Discharge Instructions (Addendum)
You have been diagnosed with a viral infection based on your symptoms and exam. Viral illnesses cannot be treated with antibiotics - they are self limiting - and you should find your symptoms resolving within a few days.  We have performed a respiratory swab checking for COVID and influenza.  If the results of this testing are positive, someone will call you if you are eligible for any antiviral treatment.    I have prescribed a strong cough medicine.  This medicine is sedating and you should not drive or operate equipment while using it.  Recommend you use over-the-counter medications for symptom control including Tylenol or ibuprofen for fever, chills or body aches. Saline mist spray is helpful for removing excess mucus from your nose.  Room humidifiers are helpful to ease breathing at night. You might also find relief of your symptoms by using a nasal decongestant such as Sudafed sinus (pseudoephedrine).  You will need to obtain this medication from behind the pharmacist counter.  Speak to the pharmacist to verify that you are not duplicating medications with other over-the-counter formulations that you may be using.

## 2022-11-27 NOTE — ED Provider Notes (Signed)
Roderic Palau    CSN: [id number] Arrival date & time: 11/27/22  1756      History   Chief Complaint Chief Complaint  Patient presents with   Cough    Entered by patient    HPI Canelo Gutman is a 36 y.o. male.    Cough   Presents to UC with productive cough and myalgias x 2 days.  He endorses chills.  No documented fever.  Most concerned about cough which is not responding to over-the-counter medication.  He has tried NyQuil.  Also not responding to benzonatate from a previous prescription.  Cough keeps him awake at night.  He is try to sleep with bed elevated and without any rest.  Past Medical History:  Diagnosis Date   GERD (gastroesophageal reflux disease)    occ- no meds    Patient Active Problem List   Diagnosis Date Noted   Arthritis of foot, right 06/18/2017   Lisfranc dislocation, right, sequela 06/18/2017    Past Surgical History:  Procedure Laterality Date   NO PAST SURGERIES     TRICEPS TENDON REPAIR Left 07/21/2019   Procedure: TRICEPS TENDON REPAIR;  Surgeon: Thornton Park, MD;  Location: [facility] ORS;  Service: Orthopedics;  Laterality: Left;       Home Medications    Prior to Admission medications   Medication Sig Start Date End Date Taking? Authorizing Provider  amphetamine-dextroamphetamine (ADDERALL XR) 25 MG 24 hr capsule Take 25 mg by mouth daily as needed (work).     [provider]  atorvastatin (LIPITOR) 20 MG tablet Take 20 mg by mouth daily.    [provider]  buPROPion (WELLBUTRIN XL) 150 MG 24 hr tablet Take 1 tablet (150 mg total) by mouth daily. Patient not taking: Reported on 11/27/2020 09/25/20   Cottle, Billey Co., MD  diphenhydrAMINE HCl, Sleep, (UNISOM SLEEPGELS) 50 MG CAPS Take 50 mg by mouth at bedtime as needed (sleep).    [provider]  Glucosamine-Chondroit-Vit C-Mn (GLUCOSAMINE 1500 COMPLEX PO) Take 2 tablets by mouth daily.     [provider]  omeprazole (PRILOSEC OTC) 20 MG  tablet Take 1 tablet (20 mg total) by mouth daily. 10/17/22 10/17/23  Nathaniel Man, MD  ondansetron (ZOFRAN-ODT) 4 MG disintegrating tablet Take 1 tablet (4 mg total) by mouth every 8 (eight) hours as needed for nausea or vomiting. 10/17/22   Nathaniel Man, MD  traZODone (DESYREL) 50 MG tablet Take 1-2 tablets (50-100 mg total) by mouth at bedtime. Patient not taking: Reported on 11/27/2020 09/25/20   Cottle, Billey Co., MD    Family History Family History  Problem Relation Age of Onset   Hyperlipidemia Mother    Hypertension Mother    Cancer Father     Social History Social History   Tobacco Use   Smoking status: Never   Smokeless tobacco: Current    Types: Chew  Substance Use Topics   Alcohol use: Yes    Comment: 3 times a week   Drug use: No     Allergies   Patient has no known allergies.   Review of Systems Review of Systems  Respiratory:  Positive for cough.      Physical Exam Triage Vital Signs ED Triage Vitals [11/27/22 1800]  Enc Vitals Group     BP (!) 144/93     Pulse Rate 84     Resp 18     Temp 98.1 F (36.7 C)     Temp src  SpO2 98 %     Weight      Height      Head Circumference      Peak Flow      Pain Score      Pain Loc      Pain Edu?      Excl. in GC?    No data found.  Updated Vital Signs BP (!) 144/93   Pulse 84   Temp 98.1 F (36.7 C)   Resp 18   SpO2 98%   Visual Acuity Right Eye Distance:   Left Eye Distance:   Bilateral Distance:    Right Eye Near:   Left Eye Near:    Bilateral Near:     Physical Exam Vitals reviewed.  Constitutional:      Appearance: Normal appearance. He is ill-appearing and diaphoretic.  Cardiovascular:     Rate and Rhythm: Normal rate and regular rhythm.     Pulses: Normal pulses.     Heart sounds: Normal heart sounds.  Pulmonary:     Effort: Pulmonary effort is normal.     Breath sounds: Normal breath sounds.  Skin:    General: Skin is warm.  Neurological:     General: No  focal deficit present.     Mental Status: He is alert and oriented to person, place, and time.  Psychiatric:        Mood and Affect: Mood normal.        Behavior: Behavior normal.      UC Treatments / Results  Labs (all labs ordered are listed, but only abnormal results are displayed) Labs Reviewed - No data to display  EKG   Radiology No results found.  Procedures Procedures (including critical care time)  Medications Ordered in UC Medications - No data to display  Initial Impression / Assessment and Plan / UC Course  I have reviewed the triage vital signs and the nursing notes.  Pertinent labs & imaging results that were available during my care of the patient were reviewed by me and considered in my medical decision making (see chart for details).   Patient is afebrile here without recent antipyretics. Satting well on room air. Overall is ill appearing, diaphoretic, though nontoxic. He appears well hydrated, without respiratory distress. Pulmonary exam is unremarkable.  Lungs CTAB without wheezes or rhonchi.  Suspect viral pathology, possibly flu or COVID.  Results of respiratory swab are pending.  Will prescribe Tussionex for nighttime cough relief.  Patient is advised to use OTC medication for control of other symptoms including Tylenol/ibuprofen for fever, body aches, chills.  Final Clinical Impressions(s) / UC Diagnoses   Final diagnoses:  None   Discharge Instructions   None    ED Prescriptions   None    PDMP not reviewed this encounter.   Charma Igo, [place] 11/27/22 1814

## 2022-11-27 NOTE — ED Triage Notes (Signed)
Pt. Presents to UC w/ c/o a productive cough and body aches for the past 2 days.

## 2022-11-30 DIAGNOSIS — M109 Gout, unspecified: Secondary | ICD-10-CM | POA: Diagnosis not present

## 2023-01-02 DIAGNOSIS — K219 Gastro-esophageal reflux disease without esophagitis: Secondary | ICD-10-CM | POA: Diagnosis not present

## 2023-01-02 DIAGNOSIS — Z23 Encounter for immunization: Secondary | ICD-10-CM | POA: Diagnosis not present

## 2023-01-02 DIAGNOSIS — I10 Essential (primary) hypertension: Secondary | ICD-10-CM | POA: Diagnosis not present

## 2023-01-02 DIAGNOSIS — F900 Attention-deficit hyperactivity disorder, predominantly inattentive type: Secondary | ICD-10-CM | POA: Diagnosis not present

## 2023-01-02 DIAGNOSIS — R739 Hyperglycemia, unspecified: Secondary | ICD-10-CM | POA: Diagnosis not present

## 2023-01-09 DIAGNOSIS — R6882 Decreased libido: Secondary | ICD-10-CM | POA: Diagnosis not present

## 2023-01-09 DIAGNOSIS — R739 Hyperglycemia, unspecified: Secondary | ICD-10-CM | POA: Diagnosis not present

## 2023-01-09 DIAGNOSIS — M109 Gout, unspecified: Secondary | ICD-10-CM | POA: Diagnosis not present

## 2023-08-07 DIAGNOSIS — F909 Attention-deficit hyperactivity disorder, unspecified type: Secondary | ICD-10-CM | POA: Diagnosis not present

## 2023-08-07 DIAGNOSIS — M109 Gout, unspecified: Secondary | ICD-10-CM | POA: Diagnosis not present

## 2023-08-22 DIAGNOSIS — R0602 Shortness of breath: Secondary | ICD-10-CM | POA: Diagnosis not present

## 2023-08-25 DIAGNOSIS — U071 COVID-19: Secondary | ICD-10-CM | POA: Diagnosis not present

## 2024-02-23 DIAGNOSIS — Z13220 Encounter for screening for lipoid disorders: Secondary | ICD-10-CM | POA: Diagnosis not present

## 2024-02-23 DIAGNOSIS — R6882 Decreased libido: Secondary | ICD-10-CM | POA: Diagnosis not present

## 2024-02-23 DIAGNOSIS — F908 Attention-deficit hyperactivity disorder, other type: Secondary | ICD-10-CM | POA: Diagnosis not present

## 2024-02-26 DIAGNOSIS — Z6833 Body mass index (BMI) 33.0-33.9, adult: Secondary | ICD-10-CM | POA: Diagnosis not present

## 2024-02-26 DIAGNOSIS — E6609 Other obesity due to excess calories: Secondary | ICD-10-CM | POA: Diagnosis not present

## 2024-02-26 DIAGNOSIS — R6882 Decreased libido: Secondary | ICD-10-CM | POA: Diagnosis not present

## 2024-04-16 DIAGNOSIS — M10061 Idiopathic gout, right knee: Secondary | ICD-10-CM | POA: Diagnosis not present

## 2024-04-16 DIAGNOSIS — M109 Gout, unspecified: Secondary | ICD-10-CM | POA: Diagnosis not present

## 2024-04-18 DIAGNOSIS — R030 Elevated blood-pressure reading, without diagnosis of hypertension: Secondary | ICD-10-CM | POA: Diagnosis not present

## 2024-04-18 DIAGNOSIS — Z6833 Body mass index (BMI) 33.0-33.9, adult: Secondary | ICD-10-CM | POA: Diagnosis not present

## 2024-04-18 DIAGNOSIS — M10061 Idiopathic gout, right knee: Secondary | ICD-10-CM | POA: Diagnosis not present

## 2024-04-27 DIAGNOSIS — F410 Panic disorder [episodic paroxysmal anxiety] without agoraphobia: Secondary | ICD-10-CM | POA: Diagnosis not present

## 2024-04-27 DIAGNOSIS — M1A00X0 Idiopathic chronic gout, unspecified site, without tophus (tophi): Secondary | ICD-10-CM | POA: Diagnosis not present

## 2024-04-27 DIAGNOSIS — R002 Palpitations: Secondary | ICD-10-CM | POA: Diagnosis not present

## 2024-04-27 DIAGNOSIS — R7989 Other specified abnormal findings of blood chemistry: Secondary | ICD-10-CM | POA: Diagnosis not present

## 2024-06-20 DIAGNOSIS — M10061 Idiopathic gout, right knee: Secondary | ICD-10-CM | POA: Diagnosis not present

## 2024-08-02 DIAGNOSIS — Z5181 Encounter for therapeutic drug level monitoring: Secondary | ICD-10-CM | POA: Diagnosis not present

## 2024-08-02 DIAGNOSIS — Z13220 Encounter for screening for lipoid disorders: Secondary | ICD-10-CM | POA: Diagnosis not present

## 2024-08-02 DIAGNOSIS — F908 Attention-deficit hyperactivity disorder, other type: Secondary | ICD-10-CM | POA: Diagnosis not present

## 2024-08-02 DIAGNOSIS — R7989 Other specified abnormal findings of blood chemistry: Secondary | ICD-10-CM | POA: Diagnosis not present

## 2024-08-04 DIAGNOSIS — R7989 Other specified abnormal findings of blood chemistry: Secondary | ICD-10-CM | POA: Diagnosis not present

## 2024-08-04 DIAGNOSIS — M1A00X0 Idiopathic chronic gout, unspecified site, without tophus (tophi): Secondary | ICD-10-CM | POA: Diagnosis not present

## 2024-08-04 DIAGNOSIS — Z13220 Encounter for screening for lipoid disorders: Secondary | ICD-10-CM | POA: Diagnosis not present

## 2024-08-25 DIAGNOSIS — R52 Pain, unspecified: Secondary | ICD-10-CM | POA: Diagnosis not present

## 2024-08-25 DIAGNOSIS — R0989 Other specified symptoms and signs involving the circulatory and respiratory systems: Secondary | ICD-10-CM | POA: Diagnosis not present

## 2024-08-25 DIAGNOSIS — R051 Acute cough: Secondary | ICD-10-CM | POA: Diagnosis not present

## 2024-08-25 DIAGNOSIS — J029 Acute pharyngitis, unspecified: Secondary | ICD-10-CM | POA: Diagnosis not present

## 2024-09-09 DIAGNOSIS — J189 Pneumonia, unspecified organism: Secondary | ICD-10-CM | POA: Diagnosis not present

## 2024-09-09 DIAGNOSIS — R051 Acute cough: Secondary | ICD-10-CM | POA: Diagnosis not present

## 2024-09-09 DIAGNOSIS — R5383 Other fatigue: Secondary | ICD-10-CM | POA: Diagnosis not present

## 2024-11-11 DIAGNOSIS — R29898 Other symptoms and signs involving the musculoskeletal system: Secondary | ICD-10-CM | POA: Diagnosis not present

## 2024-11-15 DIAGNOSIS — F908 Attention-deficit hyperactivity disorder, other type: Secondary | ICD-10-CM | POA: Diagnosis not present

## 2024-11-15 DIAGNOSIS — M1A00X0 Idiopathic chronic gout, unspecified site, without tophus (tophi): Secondary | ICD-10-CM | POA: Diagnosis not present

## 2024-12-26 DIAGNOSIS — J111 Influenza due to unidentified influenza virus with other respiratory manifestations: Secondary | ICD-10-CM | POA: Diagnosis not present
